# Patient Record
Sex: Female | Born: 1960
Health system: Southern US, Community
[De-identification: ages and names within clinical notes are randomized; demographics above are authoritative.]

## PROBLEM LIST (undated history)

## (undated) DIAGNOSIS — K2970 Gastritis, unspecified, without bleeding: Secondary | ICD-10-CM

## (undated) DIAGNOSIS — J45909 Unspecified asthma, uncomplicated: Secondary | ICD-10-CM

## (undated) DIAGNOSIS — T7840XA Allergy, unspecified, initial encounter: Secondary | ICD-10-CM

## (undated) DIAGNOSIS — E538 Deficiency of other specified B group vitamins: Secondary | ICD-10-CM

## (undated) DIAGNOSIS — E785 Hyperlipidemia, unspecified: Secondary | ICD-10-CM

## (undated) DIAGNOSIS — R0683 Snoring: Secondary | ICD-10-CM

## (undated) DIAGNOSIS — K219 Gastro-esophageal reflux disease without esophagitis: Secondary | ICD-10-CM

## (undated) DIAGNOSIS — M545 Low back pain, unspecified: Secondary | ICD-10-CM

## (undated) DIAGNOSIS — Z85820 Personal history of malignant melanoma of skin: Secondary | ICD-10-CM

## (undated) DIAGNOSIS — F32A Depression, unspecified: Secondary | ICD-10-CM

## (undated) DIAGNOSIS — F410 Panic disorder [episodic paroxysmal anxiety] without agoraphobia: Secondary | ICD-10-CM

## (undated) DIAGNOSIS — K209 Esophagitis, unspecified without bleeding: Secondary | ICD-10-CM

## (undated) DIAGNOSIS — F419 Anxiety disorder, unspecified: Secondary | ICD-10-CM

## (undated) DIAGNOSIS — F329 Major depressive disorder, single episode, unspecified: Secondary | ICD-10-CM

## (undated) DIAGNOSIS — F5104 Psychophysiologic insomnia: Secondary | ICD-10-CM

## (undated) HISTORY — DX: Personal history of malignant melanoma of skin: Z85.820

## (undated) HISTORY — DX: Snoring: R06.83

## (undated) HISTORY — DX: Unspecified asthma, uncomplicated: J45.909

## (undated) HISTORY — DX: Allergy, unspecified, initial encounter: T78.40XA

## (undated) HISTORY — DX: Psychophysiologic insomnia: F51.04

## (undated) HISTORY — DX: Gastritis, unspecified, without bleeding: K29.70

## (undated) HISTORY — DX: Low back pain, unspecified: M54.50

## (undated) HISTORY — PX: SHOULDER SURGERY: SHX246

## (undated) HISTORY — DX: Panic disorder (episodic paroxysmal anxiety): F41.0

## (undated) HISTORY — DX: Depression, unspecified: F32.A

## (undated) HISTORY — DX: Esophagitis, unspecified: K20.9

## (undated) HISTORY — DX: Esophagitis, unspecified without bleeding: K20.90

## (undated) HISTORY — DX: Major depressive disorder, single episode, unspecified: F32.9

## (undated) HISTORY — DX: Deficiency of other specified B group vitamins: E53.8

## (undated) HISTORY — PX: ABDOMINAL HYSTERECTOMY: SHX81

## (undated) HISTORY — PX: FUNCTIONAL ENDOSCOPIC SINUS SURGERY: SUR616

## (undated) HISTORY — DX: Hyperlipidemia, unspecified: E78.5

## (undated) HISTORY — PX: CHOLECYSTECTOMY: SHX55

## (undated) HISTORY — DX: Low back pain: M54.5

---

## 2004-05-21 ENCOUNTER — Ambulatory Visit: Payer: Self-pay | Admitting: Unknown Physician Specialty

## 2005-08-08 HISTORY — PX: REFRACTIVE SURGERY: SHX103

## 2005-10-05 ENCOUNTER — Ambulatory Visit: Payer: Self-pay | Admitting: Otolaryngology

## 2005-12-02 ENCOUNTER — Ambulatory Visit: Payer: Self-pay | Admitting: Family Medicine

## 2005-12-07 ENCOUNTER — Ambulatory Visit: Payer: Self-pay | Admitting: Family Medicine

## 2005-12-27 ENCOUNTER — Ambulatory Visit: Payer: Self-pay | Admitting: General Surgery

## 2006-10-26 ENCOUNTER — Emergency Department: Payer: Self-pay | Admitting: Emergency Medicine

## 2008-07-17 ENCOUNTER — Ambulatory Visit: Payer: Self-pay | Admitting: Family Medicine

## 2008-08-21 ENCOUNTER — Ambulatory Visit: Payer: Self-pay | Admitting: General Surgery

## 2008-08-25 ENCOUNTER — Ambulatory Visit: Payer: Self-pay | Admitting: General Surgery

## 2008-08-29 ENCOUNTER — Ambulatory Visit: Payer: Self-pay | Admitting: General Surgery

## 2009-11-26 ENCOUNTER — Emergency Department: Payer: Self-pay | Admitting: Emergency Medicine

## 2010-01-05 ENCOUNTER — Ambulatory Visit: Payer: Self-pay | Admitting: Unknown Physician Specialty

## 2010-01-06 LAB — HM COLONOSCOPY: HM COLON: NORMAL

## 2010-05-07 ENCOUNTER — Ambulatory Visit: Payer: Self-pay | Admitting: Unknown Physician Specialty

## 2010-05-23 ENCOUNTER — Ambulatory Visit: Payer: Self-pay | Admitting: Internal Medicine

## 2010-07-30 ENCOUNTER — Ambulatory Visit: Payer: Self-pay | Admitting: Unknown Physician Specialty

## 2010-08-23 ENCOUNTER — Encounter: Payer: Self-pay | Admitting: Unknown Physician Specialty

## 2010-09-08 ENCOUNTER — Encounter: Payer: Self-pay | Admitting: Unknown Physician Specialty

## 2010-12-30 ENCOUNTER — Ambulatory Visit: Payer: Self-pay

## 2011-08-26 ENCOUNTER — Ambulatory Visit: Payer: Self-pay | Admitting: Family Medicine

## 2012-07-08 LAB — HM MAMMOGRAPHY: HM Mammogram: NORMAL

## 2012-07-14 ENCOUNTER — Ambulatory Visit: Payer: Self-pay | Admitting: Internal Medicine

## 2012-10-04 ENCOUNTER — Emergency Department: Payer: Self-pay | Admitting: Emergency Medicine

## 2012-10-04 LAB — CBC
MCH: 30 pg (ref 26.0–34.0)
MCHC: 33.6 g/dL (ref 32.0–36.0)
MCV: 89 fL (ref 80–100)
Platelet: 212 10*3/uL (ref 150–440)
RDW: 13.6 % (ref 11.5–14.5)
WBC: 7.8 10*3/uL (ref 3.6–11.0)

## 2012-10-04 LAB — BASIC METABOLIC PANEL
Anion Gap: 4 — ABNORMAL LOW (ref 7–16)
Calcium, Total: 8.9 mg/dL (ref 8.5–10.1)
Co2: 27 mmol/L (ref 21–32)
Creatinine: 0.8 mg/dL (ref 0.60–1.30)
EGFR (African American): >60
Glucose: 103 mg/dL — ABNORMAL HIGH (ref 65–99)
Osmolality: 283 (ref 275–301)
Potassium: 3.8 mmol/L (ref 3.5–5.1)

## 2014-03-15 ENCOUNTER — Emergency Department: Payer: Self-pay | Admitting: Emergency Medicine

## 2014-09-05 ENCOUNTER — Ambulatory Visit: Payer: Self-pay | Admitting: Internal Medicine

## 2014-12-26 ENCOUNTER — Ambulatory Visit: Payer: Managed Care, Other (non HMO)

## 2014-12-26 ENCOUNTER — Ambulatory Visit
Admission: EM | Admit: 2014-12-26 | Discharge: 2014-12-26 | Disposition: A | Discharge status: Home / Self Care | Payer: Managed Care, Other (non HMO) | Attending: Family Medicine | Admitting: Family Medicine

## 2014-12-26 DIAGNOSIS — J069 Acute upper respiratory infection, unspecified: Secondary | ICD-10-CM | POA: Diagnosis not present

## 2014-12-26 DIAGNOSIS — J45901 Unspecified asthma with (acute) exacerbation: Secondary | ICD-10-CM

## 2014-12-26 DIAGNOSIS — J45909 Unspecified asthma, uncomplicated: Secondary | ICD-10-CM | POA: Diagnosis present

## 2014-12-26 HISTORY — DX: Anxiety disorder, unspecified: F41.9

## 2014-12-26 HISTORY — DX: Gastro-esophageal reflux disease without esophagitis: K21.9

## 2014-12-26 MED ORDER — AZITHROMYCIN 500 MG PO TABS: 500.0000 mg | ORAL_TABLET | Freq: Every day | ORAL | Status: AC

## 2014-12-26 MED ORDER — HYDROCOD POLST-CPM POLST ER 10-8 MG/5ML PO SUER: 5.0000 mL | Freq: Two times a day (BID) | ORAL | Status: DC | PRN

## 2014-12-26 MED ORDER — AZITHROMYCIN 500 MG PO TABS: 500.0000 mg | ORAL_TABLET | Freq: Every day | ORAL | Status: DC

## 2014-12-26 MED ORDER — IPRATROPIUM-ALBUTEROL 0.5-2.5 (3) MG/3ML IN SOLN
3.0000 mL | Freq: Once | RESPIRATORY_TRACT | Status: AC
  Administered 2014-12-26: 3 mL via RESPIRATORY_TRACT

## 2014-12-26 NOTE — ED Provider Notes (Signed)
SUBJECTIVE:  Jill Sampson is a 54 y.o. female who complains of sore throat, nasal congestion, cough for the last few days. Denies CP, SOB, N/V/D, abdominal pain, severe headache. Has tried OTC Medication without much relief. Has hx of asthma. Has used inhaler once daily. Denies hx of smoking.   Review of systems negative except mentioned above.   OBJECTIVE: She appears well, vital signs are as noted.  General: NAD HEENT: mild pharyngeal erythema, no exudate, no erythema of TMs, no cervical LAD Respiratory: CTA B Cardiology: RRR Abdomen: +BS, NT/ND, no guarding or rebound Neurological: CN II-XII grossly intact   ASSESSMENT:  URI, Asthma  PLAN: Mild hyperinflation noted on CXR. Patient requests Zithromax 500mg s for 3 days, discussed Delysm prn, patient requests Tussionex for nighttime use, use of inhaler as prescribed when necessary, rest, hydration, seek medical attention if symptoms persist or worsen.        Jolene ProvostKirtida Carols Clemence, MD 12/26/14 [phone]

## 2014-12-26 NOTE — ED Notes (Signed)
Azithromycin and Tussionex ordered in error by Dr. Allena KatzPatel

## 2014-12-26 NOTE — Discharge Instructions (Signed)

## 2014-12-26 NOTE — ED Notes (Signed)
Started 2 days ago with sore throat. Has progressed into chest. + productive green cough. Fever (temp not taken)

## 2015-02-18 ENCOUNTER — Other Ambulatory Visit: Payer: Self-pay

## 2015-02-18 MED ORDER — ESOMEPRAZOLE MAGNESIUM 40 MG PO CPDR: 40.0000 mg | DELAYED_RELEASE_CAPSULE | Freq: Every day | ORAL | Status: DC

## 2015-02-18 MED ORDER — BUPROPION HCL ER (XL) 150 MG PO TB24: 150.0000 mg | ORAL_TABLET | Freq: Every day | ORAL | Status: DC

## 2015-02-18 MED ORDER — ESCITALOPRAM OXALATE 10 MG PO TABS: 10.0000 mg | ORAL_TABLET | Freq: Every day | ORAL | Status: DC

## 2015-02-18 NOTE — Telephone Encounter (Signed)
Patient requesting refill. 

## 2015-02-20 ENCOUNTER — Other Ambulatory Visit: Payer: Self-pay

## 2015-02-24 ENCOUNTER — Other Ambulatory Visit: Payer: Self-pay

## 2015-02-24 ENCOUNTER — Telehealth: Payer: Self-pay | Admitting: Family Medicine

## 2015-02-24 MED ORDER — MELOXICAM 15 MG PO TABS: 15.0000 mg | ORAL_TABLET | Freq: Every day | ORAL | Status: DC | PRN

## 2015-02-24 MED ORDER — ESOMEPRAZOLE MAGNESIUM 40 MG PO CPDR: 40.0000 mg | DELAYED_RELEASE_CAPSULE | Freq: Every day | ORAL | Status: DC

## 2015-02-24 MED ORDER — ESCITALOPRAM OXALATE 10 MG PO TABS: 10.0000 mg | ORAL_TABLET | Freq: Every day | ORAL | Status: DC

## 2015-02-24 MED ORDER — BUPROPION HCL ER (XL) 150 MG PO TB24: 150.0000 mg | ORAL_TABLET | Freq: Every day | ORAL | Status: DC

## 2015-02-24 NOTE — Telephone Encounter (Signed)
Patient is requesting status on her refill requests: wellbutrin, lexapro, nexium, and mobic. Please send to [facility].

## 2015-02-24 NOTE — Telephone Encounter (Signed)
Per pt ins she must use [facility] and get 90 day supply please fax rx to new pharmacy for 90 day supply

## 2015-03-21 ENCOUNTER — Encounter: Payer: Self-pay | Admitting: Family Medicine

## 2015-03-21 DIAGNOSIS — E785 Hyperlipidemia, unspecified: Secondary | ICD-10-CM | POA: Insufficient documentation

## 2015-03-21 DIAGNOSIS — Z90710 Acquired absence of both cervix and uterus: Secondary | ICD-10-CM | POA: Insufficient documentation

## 2015-03-21 DIAGNOSIS — J3089 Other allergic rhinitis: Secondary | ICD-10-CM

## 2015-03-21 DIAGNOSIS — G8929 Other chronic pain: Secondary | ICD-10-CM | POA: Insufficient documentation

## 2015-03-21 DIAGNOSIS — R0683 Snoring: Secondary | ICD-10-CM | POA: Insufficient documentation

## 2015-03-21 DIAGNOSIS — Z85820 Personal history of malignant melanoma of skin: Secondary | ICD-10-CM | POA: Insufficient documentation

## 2015-03-21 DIAGNOSIS — F330 Major depressive disorder, recurrent, mild: Secondary | ICD-10-CM | POA: Insufficient documentation

## 2015-03-21 DIAGNOSIS — M545 Low back pain, unspecified: Secondary | ICD-10-CM | POA: Insufficient documentation

## 2015-03-21 DIAGNOSIS — G4700 Insomnia, unspecified: Secondary | ICD-10-CM | POA: Insufficient documentation

## 2015-03-21 DIAGNOSIS — K219 Gastro-esophageal reflux disease without esophagitis: Secondary | ICD-10-CM | POA: Insufficient documentation

## 2015-03-21 DIAGNOSIS — J4540 Moderate persistent asthma, uncomplicated: Secondary | ICD-10-CM | POA: Insufficient documentation

## 2015-03-21 DIAGNOSIS — J302 Other seasonal allergic rhinitis: Secondary | ICD-10-CM | POA: Insufficient documentation

## 2015-03-21 DIAGNOSIS — M722 Plantar fascial fibromatosis: Secondary | ICD-10-CM | POA: Insufficient documentation

## 2015-03-21 DIAGNOSIS — E538 Deficiency of other specified B group vitamins: Secondary | ICD-10-CM | POA: Insufficient documentation

## 2015-03-24 ENCOUNTER — Ambulatory Visit: Payer: Self-pay | Admitting: Family Medicine

## 2015-04-09 ENCOUNTER — Ambulatory Visit
Admission: EM | Admit: 2015-04-09 | Discharge: 2015-04-09 | Disposition: A | Discharge status: Home / Self Care | Payer: Managed Care, Other (non HMO) | Attending: Emergency Medicine | Admitting: Emergency Medicine

## 2015-04-09 ENCOUNTER — Encounter: Payer: Self-pay | Admitting: Emergency Medicine

## 2015-04-09 DIAGNOSIS — J329 Chronic sinusitis, unspecified: Secondary | ICD-10-CM

## 2015-04-09 MED ORDER — AZITHROMYCIN 500 MG PO TABS: ORAL_TABLET | ORAL | Status: DC

## 2015-04-09 NOTE — Discharge Instructions (Signed)

## 2015-04-09 NOTE — ED Provider Notes (Signed)
CSN: [id number]     Arrival date & time 04/09/15  1616 History   First MD Initiated Contact with Patient 04/09/15 1636     Chief Complaint  Patient presents with  . Facial Pain   (Consider location/radiation/quality/duration/timing/severity/associated sxs/prior Treatment) HPI  This a 54 year old female who presents with sinus issues been present for 3 weeks. States that they are worsening as time goes on. Unfortunately she has frequent sinus infections the last time she was here was in May 2016. This episode has caused her to have significant sinus pressure and drainage her teeth are hurting and she has a headache to her other sinus infections. She has not had a fever and has not experienced chills. She is afebrile today. She has been coughing recently from drainage and has not been productive. She  even used an albuterol inhaler for some asthma type symptoms that she had last night.  Past Medical History  Diagnosis Date  . GERD (gastroesophageal reflux disease)   . Anxiety   . Asthma   . Snoring   . Allergy   . Low back pain   . Dyslipidemia   . Vitamin B 12 deficiency   . Panic attack   . Chronic insomnia   . Depression   . History of melanoma    Past Surgical History  Procedure Laterality Date  . Abdominal hysterectomy    . Shoulder surgery Left   . Functional endoscopic sinus surgery Bilateral   . Refractive surgery  2007  . Cholecystectomy     Family History  Problem Relation Age of Onset  . Alzheimer's disease Mother   . Hypertension Mother   . Stroke Mother   . Migraines Mother   . Heart attack Father   . Heart disease Father   . Cancer Sister     Breast  . Stroke Maternal Aunt   . Diabetes Maternal Uncle    Social History  Substance Use Topics  . Smoking status: Never Smoker   . Smokeless tobacco: None  . Alcohol Use: No   OB History    No data available     Review of Systems  Constitutional: Negative for fever, chills and fatigue.  HENT: Positive for  congestion, postnasal drip, rhinorrhea, sinus pressure and sore throat.   Eyes: Positive for discharge. Negative for pain and itching.  Respiratory: Positive for cough.   All other systems reviewed and are negative.   Allergies  Levofloxacin; Amoxicillin; and Penicillins  Home Medications   Prior to Admission medications   Medication Sig Start Date End Date Taking? Authorizing Provider  azithromycin (ZITHROMAX) 500 MG tablet Take 5oo mg daily for 3 days. 04/09/15   Lutricia Feil, PA-C  buPROPion (WELLBUTRIN XL) 150 MG 24 hr tablet Take 1 tablet (150 mg total) by mouth daily. 02/24/15   Alba Cory, MD  chlorpheniramine-HYDROcodone (TUSSIONEX PENNKINETIC ER) 10-8 MG/5ML SUER Take 5 mLs by mouth every 12 (twelve) hours as needed for cough. 12/26/14   Jolene Provost, MD  EEMT HS 0.625-1.25 MG per tablet Take 1 tablet by mouth daily. 12/21/14   Historical Provider, MD  escitalopram (LEXAPRO) 10 MG tablet Take 1 tablet (10 mg total) by mouth daily. 02/24/15   Alba Cory, MD  esomeprazole (NEXIUM) 40 MG capsule Take 1 capsule (40 mg total) by mouth daily at 12 noon. 02/24/15   Alba Cory, MD  meloxicam (MOBIC) 15 MG tablet Take 1 tablet (15 mg total) by mouth daily as needed. 02/24/15   Alba Cory,  MD   Meds Ordered and Administered this Visit  Medications - No data to display  BP 139/78 mmHg  Pulse 62  Temp(Src) 97.8 F (36.6 C) (Tympanic)  Resp 18  Ht  (1.702 m)  Wt 175 lb (79.379 kg)  BMI 27.40 kg/m2  SpO2 99% No data found.   Physical Exam  Constitutional: She is oriented to person, place, and time. She appears well-developed and well-nourished.  HENT:  Head: Atraumatic.  Left Ear: External ear normal.  The right TM is dull. She has tenderness to percussion over the sinuses maxillary and frontal. There is postnasal drip present.  Eyes: EOM are normal. Pupils are equal, round, and reactive to light. Right eye exhibits no discharge. Left eye exhibits no discharge.   Neck: Normal range of motion. Neck supple.  Pulmonary/Chest: Effort normal and breath sounds normal. No respiratory distress. She has no wheezes. She has no rales.  Musculoskeletal: Normal range of motion. She exhibits no edema.  Lymphadenopathy:    She has no cervical adenopathy.  Neurological: She is alert and oriented to person, place, and time.  Skin: Skin is warm and dry. No rash noted. No erythema.  Psychiatric: She has a normal mood and affect. Her behavior is normal. Judgment and thought content normal.  Nursing note and vitals reviewed.   ED Course  Procedures (including critical care time)  Labs Review Labs Reviewed - No data to display  Imaging Review No results found.   Visual Acuity Review  Right Eye Distance:   Left Eye Distance:   Bilateral Distance:    Right Eye Near:   Left Eye Near:    Bilateral Near:         MDM   1. Chronic sinusitis, unspecified location    Discharge Medication List as of 04/09/2015  5:07 PM    START taking these medications   Details  azithromycin (ZITHROMAX) 500 MG tablet Take 5oo mg daily for 3 days., Print        Plan: 1. Diagnosis reviewed with patient 2. rx as per orders; risks, benefits, potential side effects reviewed with patient 3. Recommend supportive treatment with Flonase ,ibuprofen and rest. Increase fluids. Use tussionex that you have at home PRN for cough at night. 4. F/u prn if symptoms worsen or don't improve   Lutricia Feil, PA-C 04/09/15 1713

## 2015-04-09 NOTE — ED Notes (Signed)
Sinus pressure

## 2015-05-07 ENCOUNTER — Ambulatory Visit (INDEPENDENT_AMBULATORY_CARE_PROVIDER_SITE_OTHER): Payer: Managed Care, Other (non HMO) | Admitting: Family Medicine

## 2015-05-07 ENCOUNTER — Encounter (INDEPENDENT_AMBULATORY_CARE_PROVIDER_SITE_OTHER): Payer: Self-pay

## 2015-05-07 ENCOUNTER — Encounter: Payer: Self-pay | Admitting: Family Medicine

## 2015-05-07 VITALS — BP 122/70 | HR 66 | Temp 98.4°F | Resp 18 | Ht 67.0 in | Wt 189.9 lb

## 2015-05-07 DIAGNOSIS — F324 Major depressive disorder, single episode, in partial remission: Secondary | ICD-10-CM

## 2015-05-07 DIAGNOSIS — K449 Diaphragmatic hernia without obstruction or gangrene: Secondary | ICD-10-CM

## 2015-05-07 DIAGNOSIS — E785 Hyperlipidemia, unspecified: Secondary | ICD-10-CM

## 2015-05-07 DIAGNOSIS — J309 Allergic rhinitis, unspecified: Secondary | ICD-10-CM | POA: Diagnosis not present

## 2015-05-07 DIAGNOSIS — K219 Gastro-esophageal reflux disease without esophagitis: Secondary | ICD-10-CM

## 2015-05-07 DIAGNOSIS — Z79899 Other long term (current) drug therapy: Secondary | ICD-10-CM

## 2015-05-07 DIAGNOSIS — M2550 Pain in unspecified joint: Secondary | ICD-10-CM

## 2015-05-07 DIAGNOSIS — Z23 Encounter for immunization: Secondary | ICD-10-CM | POA: Diagnosis not present

## 2015-05-07 DIAGNOSIS — E538 Deficiency of other specified B group vitamins: Secondary | ICD-10-CM | POA: Diagnosis not present

## 2015-05-07 DIAGNOSIS — J3089 Other allergic rhinitis: Secondary | ICD-10-CM

## 2015-05-07 DIAGNOSIS — F325 Major depressive disorder, single episode, in full remission: Secondary | ICD-10-CM

## 2015-05-07 DIAGNOSIS — J302 Other seasonal allergic rhinitis: Secondary | ICD-10-CM

## 2015-05-07 DIAGNOSIS — G4700 Insomnia, unspecified: Secondary | ICD-10-CM | POA: Diagnosis not present

## 2015-05-07 MED ORDER — ESOMEPRAZOLE MAGNESIUM 40 MG PO CPDR: 40.0000 mg | DELAYED_RELEASE_CAPSULE | Freq: Every day | ORAL | Status: DC

## 2015-05-07 MED ORDER — ESCITALOPRAM OXALATE 10 MG PO TABS: 10.0000 mg | ORAL_TABLET | Freq: Every day | ORAL | Status: DC

## 2015-05-07 MED ORDER — FLUTICASONE PROPIONATE 50 MCG/ACT NA SUSP: 2.0000 | NASAL | Status: DC | PRN

## 2015-05-07 MED ORDER — ALPRAZOLAM 0.5 MG PO TABS: 0.5000 mg | ORAL_TABLET | ORAL | Status: DC | PRN

## 2015-05-07 MED ORDER — MELOXICAM 15 MG PO TABS: 15.0000 mg | ORAL_TABLET | Freq: Every day | ORAL | Status: DC | PRN

## 2015-05-07 MED ORDER — BUPROPION HCL ER (XL) 150 MG PO TB24: 150.0000 mg | ORAL_TABLET | Freq: Every day | ORAL | Status: DC

## 2015-05-07 NOTE — Progress Notes (Signed)
Name: Jill Sampson   MRN: [id number]    DOB: [birth year]   Date:05/07/2015       Progress Note  Subjective  Chief Complaint  Chief Complaint  Patient presents with  . Medication Refill    follow-up  . Depression  . Asthma    only flares when sick    HPI  Major Depression in Remission: she has a long history of depression, but is feeling well with Wellbutrin XL, she feels like she is back to her baseline. No crying spells, no fatigue, no anhedonia.    Asthma   05/07/15 1639  Asthma History  Symptoms 0-2 days/week  Nighttime Awakenings 0-2/month  Asthma interference with normal activity No limitations  SABA use (not for EIB) 0-2 days/wk  Risk: Exacerbations requiring oral systemic steroids 0-1 / year  Asthma Severity Intermittent   Hiatal Hernia: seen by Dr.Elliott many years ago and per patient diagnosed with hiatal hernia, having more reflux symptoms and is going back,she is willing to have surgery to control symptoms  Arthralgia: states that for the past couple of years she wakes up feeling stiff all over, and lasts about 1 hour to feel better. Also has generalized joint aches and takes Meloxicam to control symptoms every other day. No redness , no swelling. No family of RF or autoimmune disease.   Insomnia; doing well, takes alprazolam prn only   B12: stopped B12 supplementation, but denies fatigue  Dyslipidemia: she is not on medication, no labs in years, because insurance does not cover [facility], she will try to go to [facility] to have labs done  Patient Active Problem List   Diagnosis Date Noted  . Ventral hernia without obstruction or gangrene 05/07/2015  . Arthralgia 05/07/2015  . Insomnia, persistent 03/21/2015  . Chronic LBP 03/21/2015  . Dyslipidemia 03/21/2015  . Gastro-esophageal reflux disease without esophagitis 03/21/2015  . H/O: hysterectomy 03/21/2015  . H/O Malignant melanoma 03/21/2015  . Depression, major, recurrent, mild 03/21/2015  . Asthma,  mild intermittent 03/21/2015  . Plantar fasciitis 03/21/2015  . B12 deficiency 03/21/2015  . Snores 03/21/2015  . Perennial allergic rhinitis with seasonal variation 03/21/2015    Past Surgical History  Procedure Laterality Date  . Abdominal hysterectomy    . Shoulder surgery Left   . Functional endoscopic sinus surgery Bilateral   . Refractive surgery  2007  . Cholecystectomy      Family History  Problem Relation Age of Onset  . Alzheimer's disease Mother   . Hypertension Mother   . Stroke Mother   . Migraines Mother   . Heart attack Father   . Heart disease Father   . Cancer Sister     Breast  . Stroke Maternal Aunt   . Diabetes Maternal Uncle     Social History   Social History  . Marital Status: Single    Spouse Name: N/A  . Number of Children: N/A  . Years of Education: N/A   Occupational History  . Not on file.   Social History Main Topics  . Smoking status: Never Smoker   . Smokeless tobacco: Never Used  . Alcohol Use: No  . Drug Use: Not on file  . Sexual Activity: Yes   Other Topics Concern  . Not on file   Social History Narrative     Current outpatient prescriptions:  .  albuterol (PROVENTIL HFA) 108 (90 BASE) MCG/ACT inhaler, Inhale 2 puffs into the lungs every 4 (four) hours as needed., Disp: , Rfl:  .  ALPRAZolam (XANAX) 0.5 MG tablet, Take 1 tablet (0.5 mg total) by mouth as needed., Disp: 30 tablet, Rfl: 0 .  buPROPion (WELLBUTRIN XL) 150 MG 24 hr tablet, Take 1 tablet (150 mg total) by mouth daily., Disp: 90 tablet, Rfl: 0 .  escitalopram (LEXAPRO) 10 MG tablet, Take 1 tablet (10 mg total) by mouth daily., Disp: 90 tablet, Rfl: 0 .  esomeprazole (NEXIUM) 40 MG capsule, Take 1 capsule (40 mg total) by mouth daily at 12 noon., Disp: 90 capsule, Rfl: 1 .  fluticasone (FLONASE) 50 MCG/ACT nasal spray, Place 2 sprays into both nostrils as needed., Disp: 48 g, Rfl: 1 .  meloxicam (MOBIC) 15 MG tablet, Take 1 tablet (15 mg total) by mouth daily  as needed., Disp: 90 tablet, Rfl: 0  Allergies  Allergen Reactions  . Levofloxacin     rash  . Amoxicillin Rash  . Penicillins Rash     ROS  Constitutional: Negative for fever , positive for weight change.  Respiratory: Negative for cough and shortness of breath.   Cardiovascular: Negative for chest pain or palpitations.  Gastrointestinal: Negative for abdominal pain, no bowel changes.  Musculoskeletal: Negative for gait problem or joint swelling.  Skin: Negative for rash.  Neurological: Negative for dizziness or headache.  No other specific complaints in a complete review of systems (except as listed in HPI above).   Objective  Filed Vitals:   05/07/15 1608  BP: 122/70  Pulse: 66  Temp: 98.4 F (36.9 C)  TempSrc: Oral  Resp: 18  Height:  (1.702 m)  Weight: 189 lb 14.4 oz (86.138 kg)  SpO2: 97%    Body mass index is 29.74 kg/(m^2).  Physical Exam  Constitutional: Patient appears well-developed and well-nourished. Obese  No distress.  HEENT: head atraumatic, normocephalic, pupils equal and reactive to light, neck supple, throat within normal limits Cardiovascular: Normal rate, regular rhythm and normal heart sounds.  No murmur heard. No BLE edema. Pulmonary/Chest: Effort normal and breath sounds normal. No respiratory distress. Abdominal: Soft.  There is no tenderness. Psychiatric: Patient has a normal mood and affect. behavior is normal. Judgment and thought content normal. Muscular Skeletal: no pain during examination, normal low back exam, no synovitis   PHQ2/9: Depression screen PHQ 2/9 05/07/2015  Decreased Interest 0  Down, Depressed, Hopeless 0  PHQ - 2 Score 0    Fall Risk: Fall Risk  05/07/2015  Falls in the past year? No     Functional Status Survey: Is the patient deaf or have difficulty hearing?: No Does the patient have difficulty seeing, even when wearing glasses/contacts?: Yes (contacts) Does the patient have difficulty  concentrating, remembering, or making decisions?: No Does the patient have difficulty walking or climbing stairs?: No Does the patient have difficulty dressing or bathing?: No Does the patient have difficulty doing errands alone such as visiting a doctor's office or shopping?: No    Assessment & Plan  1. Depression, major, recurrent, mild  - escitalopram (LEXAPRO) 10 MG tablet; Take 1 tablet (10 mg total) by mouth daily.  Dispense: 90 tablet; Refill: 0 - buPROPion (WELLBUTRIN XL) 150 MG 24 hr tablet; Take 1 tablet (150 mg total) by mouth daily.  Dispense: 90 tablet; Refill: 0  2. Needs flu shot  - Flu Vaccine QUAD 36+ mos PF IM (Fluarix & Fluzone Quad PF)  3. Dyslipidemia  - Lipid panel  4. Hiatal hernia  Following up with GI soon.   5. B12 deficiency  - Vitamin B12  6. Gastro-esophageal reflux disease without esophagitis  - esomeprazole (NEXIUM) 40 MG capsule; Take 1 capsule (40 mg total) by mouth daily at 12 noon.  Dispense: 90 capsule; Refill: 1  7. Insomnia, persistent  - ALPRAZolam (XANAX) 0.5 MG tablet; Take 1 tablet (0.5 mg total) by mouth as needed.  Dispense: 30 tablet; Refill: 0  8. Long-term use of high-risk medication  - Comprehensive metabolic panel  9. Perennial allergic rhinitis with seasonal variation  - fluticasone (FLONASE) 50 MCG/ACT nasal spray; Place 2 sprays into both nostrils as needed.  Dispense: 48 g; Refill: 1  10. Arthralgia  Used to play sports, currently sedentary work  - meloxicam (MOBIC) 15 MG tablet; Take 1 tablet (15 mg total) by mouth daily as needed.  Dispense: 90 tablet; Refill: 0 - Sedimentation rate - Rheumatoid Factor

## 2015-05-07 NOTE — Progress Notes (Signed)
   05/07/15 1639  Asthma History  Symptoms 0-2 days/week  Nighttime Awakenings 0-2/month  Asthma interference with normal activity No limitations  SABA use (not for EIB) 0-2 days/wk  Risk: Exacerbations requiring oral systemic steroids 0-1 / year  Asthma Severity Intermittent

## 2015-07-23 ENCOUNTER — Ambulatory Visit
Admission: EM | Admit: 2015-07-23 | Discharge: 2015-07-23 | Disposition: A | Discharge status: Home / Self Care | Payer: Managed Care, Other (non HMO) | Attending: Family Medicine | Admitting: Family Medicine

## 2015-07-23 DIAGNOSIS — J0100 Acute maxillary sinusitis, unspecified: Secondary | ICD-10-CM

## 2015-07-23 DIAGNOSIS — J40 Bronchitis, not specified as acute or chronic: Secondary | ICD-10-CM

## 2015-07-23 MED ORDER — AZITHROMYCIN 500 MG PO TABS: ORAL_TABLET | ORAL | Status: DC

## 2015-07-23 MED ORDER — HYDROCOD POLST-CPM POLST ER 10-8 MG/5ML PO SUER: 5.0000 mL | Freq: Two times a day (BID) | ORAL | Status: DC | PRN

## 2015-07-23 NOTE — Discharge Instructions (Signed)
Sinusitis, Adult °Sinusitis is redness, soreness, and puffiness (inflammation) of the air pockets in the bones of your face (sinuses). The redness, soreness, and puffiness can cause air and mucus to get trapped in your sinuses. This can allow germs to grow and cause an infection.  °HOME CARE  °· Drink enough fluids to keep your pee (urine) clear or pale yellow. °· Use a humidifier in your home. °· Run a hot shower to create steam in the bathroom. Sit in the bathroom with the door closed. Breathe in the steam 3-4 times a day. °· Put a warm, moist washcloth on your face 3-4 times a day, or as told by your doctor. °· Use salt water sprays (saline sprays) to wet the thick fluid in your nose. This can help the sinuses drain. °· Only take medicine as told by your doctor. °GET HELP RIGHT AWAY IF:  °· Your pain gets worse. °· You have very bad headaches. °· You are sick to your stomach (nauseous). °· You throw up (vomit). °· You are very sleepy (drowsy) all the time. °· Your face is puffy (swollen). °· Your vision changes. °· You have a stiff neck. °· You have trouble breathing. °MAKE SURE YOU:  °· Understand these instructions. °· Will watch your condition. °· Will get help right away if you are not doing well or get worse. °  °This information is not intended to replace advice given to you by your health care provider. Make sure you discuss any questions you have with your health care provider. °  °Document Released: 01/11/2008 Document Revised: 08/15/2014 Document Reviewed: 02/28/2012 °Elsevier Interactive Patient Education ©2016 [employer] ° °Upper Respiratory Infection, Adult °Most upper respiratory infections (URIs) are caused by a virus. A URI affects the nose, throat, and upper air passages. The most common type of URI is often called "the common cold." °HOME CARE  °· Take medicines only as told by your doctor. °· Gargle warm saltwater or take cough drops to comfort your throat as told by your doctor. °· Use a  warm mist humidifier or inhale steam from a shower to increase air moisture. This may make it easier to breathe. °· Drink enough fluid to keep your pee (urine) clear or pale yellow. °· Eat soups and other clear broths. °· Have a healthy diet. °· Rest as needed. °· Go back to work when your fever is gone or your doctor says it is okay. °¨ You may need to stay home longer to avoid giving your URI to others. °¨ You can also wear a face mask and wash your hands often to prevent spread of the virus. °· Use your inhaler more if you have asthma. °· Do not use any tobacco products, including cigarettes, chewing tobacco, or electronic cigarettes. If you need help quitting, ask your doctor. °GET HELP IF: °· You are getting worse, not better. °· Your symptoms are not helped by medicine. °· You have chills. °· You are getting more short of breath. °· You have brown or red mucus. °· You have yellow or brown discharge from your nose. °· You have pain in your face, especially when you bend forward. °· You have a fever. °· You have puffy (swollen) neck glands. °· You have pain while swallowing. °· You have white areas in the back of your throat. °GET HELP RIGHT AWAY IF:  °· You have very bad or constant: °¨ Headache. °¨ Ear pain. °¨ Pain in your forehead, behind your eyes, and over   your cheekbones (sinus pain). °¨ Chest pain. °· You have long-lasting (chronic) lung disease and any of the following: °¨ Wheezing. °¨ Long-lasting cough. °¨ Coughing up blood. °¨ A change in your usual mucus. °· You have a stiff neck. °· You have changes in your: °¨ Vision. °¨ Hearing. °¨ Thinking. °¨ Mood. °MAKE SURE YOU:  °· Understand these instructions. °· Will watch your condition. °· Will get help right away if you are not doing well or get worse. °  °This information is not intended to replace advice given to you by your health care provider. Make sure you discuss any questions you have with your health care provider. °  °Document Released:  01/11/2008 Document Revised: 12/09/2014 Document Reviewed: 10/30/2013 °Elsevier Interactive Patient Education ©2016 [employer] Inc. ° °

## 2015-07-23 NOTE — ED Notes (Signed)
Started 2 weeks ago with sinus congestion. "I need a Z-Pack".

## 2015-07-23 NOTE — ED Provider Notes (Signed)
CSN: [id number]     Arrival date & time 07/23/15  1154 History   First MD Initiated Contact with Patient 07/23/15 1409    Nurses notes were reviewed. Chief Complaint  Patient presents with  . Facial Pain   Patient is here because of sinus pain patient pain. She reports symptoms started about 2 weeks ago. Zithromax worked well for the past only the 500 mg tablets. She also had a sore throat and some pain on the right side of her years. She has cough which she's used Tussionex for the Tussionex that she is just day with cough continue was an older prescription so she wants a new prescription possible  (Consider location/radiation/quality/duration/timing/severity/associated sxs/prior Treatment) Patient is a 54 y.o. female presenting with URI. The history is provided by the patient. No language interpreter was used.  URI Presenting symptoms: cough, ear pain and sore throat   Severity:  Moderate Onset quality:  Sudden Timing:  Constant Progression:  Waxing and waning Chronicity:  New Relieved by:  Nothing Ineffective treatments:  Prescription medications (tussionex from home) Associated symptoms: sinus pain   Risk factors: not elderly, no chronic cardiac disease, no chronic kidney disease and no chronic respiratory disease     Past Medical History  Diagnosis Date  . GERD (gastroesophageal reflux disease)   . Anxiety   . Asthma   . Snoring   . Allergy   . Low back pain   . Dyslipidemia   . Vitamin B 12 deficiency   . Panic attack   . Chronic insomnia   . Depression   . History of melanoma    Past Surgical History  Procedure Laterality Date  . Abdominal hysterectomy    . Shoulder surgery Left   . Functional endoscopic sinus surgery Bilateral   . Refractive surgery  2007  . Cholecystectomy     Family History  Problem Relation Age of Onset  . Alzheimer's disease Mother   . Hypertension Mother   . Stroke Mother   . Migraines Mother   . Heart attack Father   . Heart  disease Father   . Cancer Sister     Breast  . Stroke Maternal Aunt   . Diabetes Maternal Uncle    Social History  Substance Use Topics  . Smoking status: Never Smoker   . Smokeless tobacco: Never Used  . Alcohol Use: No   OB History    No data available     Review of Systems  HENT: Positive for ear pain, facial swelling, sinus pressure and sore throat.   Respiratory: Positive for cough.   All other systems reviewed and are negative.   Allergies  Levofloxacin; Amoxicillin; and Penicillins  Home Medications   Prior to Admission medications   Medication Sig Start Date End Date Taking? Authorizing Provider  albuterol (PROVENTIL HFA) 108 (90 BASE) MCG/ACT inhaler Inhale 2 puffs into the lungs every 4 (four) hours as needed. 11/08/13  Yes Historical Provider, MD  buPROPion (WELLBUTRIN XL) 150 MG 24 hr tablet Take 1 tablet (150 mg total) by mouth daily. 05/07/15  Yes Alba Cory, MD  escitalopram (LEXAPRO) 10 MG tablet Take 1 tablet (10 mg total) by mouth daily. 05/07/15  Yes Alba Cory, MD  esomeprazole (NEXIUM) 40 MG capsule Take 1 capsule (40 mg total) by mouth daily at 12 noon. 05/07/15  Yes Alba Cory, MD  estrogen-methylTESTOSTERone (ESTRATEST) 1.25-2.5 MG tablet Take 1 tablet by mouth daily.   Yes Historical Provider, MD  fluticasone (FLONASE) 50 MCG/ACT  nasal spray Place 2 sprays into both nostrils as needed. 05/07/15  Yes Alba CoryKrichna Sowles, MD  meloxicam (MOBIC) 15 MG tablet Take 1 tablet (15 mg total) by mouth daily as needed. 05/07/15  Yes Alba CoryKrichna Sowles, MD  ALPRAZolam Prudy Feeler(XANAX) 0.5 MG tablet Take 1 tablet (0.5 mg total) by mouth as needed. 05/07/15   Alba CoryKrichna Sowles, MD  azithromycin (ZITHROMAX) 500 MG tablet Take 1 tablet daily for 5 days 07/23/15   Hassan RowanEugene Fryda Molenda, MD  chlorpheniramine-HYDROcodone [facility](TUSSIONEX PENNKINETIC ER) 10-8 MG/5ML SUER Take 5 mLs by mouth every 12 (twelve) hours as needed for cough. 07/23/15   Hassan RowanEugene Aryan Sparks, MD   Meds Ordered and Administered this Visit   Medications - No data to display  BP 141/74 mmHg  Pulse 62  Temp(Src) 96.6 F (35.9 C) (Tympanic)  Resp 18  Ht 5\' 8"  (1.727 m)  Wt 165 lb (74.844 kg)  BMI 25.09 kg/m2  SpO2 100% No data found.   Physical Exam  ED Course  Procedures (including critical care time)  Labs Review Labs Reviewed - No data to display  Imaging Review No results found.   Visual Acuity Review  Right Eye Distance:   Left Eye Distance:   Bilateral Distance:    Right Eye Near:   Left Eye Near:    Bilateral Near:         MDM   1. Acute maxillary sinusitis, recurrence not specified   2. Bronchitis    We'll place her on Zithromax 500 mg for 5 days Tussionex 1 teaspoon twice a day. Work note given for today and tomorrow and return to see PCP if not better 1-2 weeks.    Hassan RowanEugene Wayne Brunker, MD 07/23/15 [phone]

## 2015-08-07 ENCOUNTER — Ambulatory Visit: Payer: Managed Care, Other (non HMO) | Admitting: Family Medicine

## 2015-09-10 ENCOUNTER — Other Ambulatory Visit: Payer: Self-pay | Admitting: Family Medicine

## 2015-09-11 NOTE — Telephone Encounter (Signed)
Patient requesting refill. 

## 2015-09-14 NOTE — Telephone Encounter (Signed)
Left voice message to inform patient that prescription where sent to the pharmacy and that she need to schedule appointment

## 2015-10-08 ENCOUNTER — Ambulatory Visit (INDEPENDENT_AMBULATORY_CARE_PROVIDER_SITE_OTHER): Payer: Managed Care, Other (non HMO) | Admitting: Family Medicine

## 2015-10-08 ENCOUNTER — Encounter: Payer: Self-pay | Admitting: Family Medicine

## 2015-10-08 VITALS — BP 128/76 | HR 71 | Temp 98.8°F | Resp 16 | Wt 199.2 lb

## 2015-10-08 DIAGNOSIS — E538 Deficiency of other specified B group vitamins: Secondary | ICD-10-CM

## 2015-10-08 DIAGNOSIS — E785 Hyperlipidemia, unspecified: Secondary | ICD-10-CM | POA: Diagnosis not present

## 2015-10-08 DIAGNOSIS — Z79899 Other long term (current) drug therapy: Secondary | ICD-10-CM

## 2015-10-08 DIAGNOSIS — R635 Abnormal weight gain: Secondary | ICD-10-CM

## 2015-10-08 DIAGNOSIS — K2970 Gastritis, unspecified, without bleeding: Secondary | ICD-10-CM | POA: Insufficient documentation

## 2015-10-08 DIAGNOSIS — J4521 Mild intermittent asthma with (acute) exacerbation: Secondary | ICD-10-CM

## 2015-10-08 DIAGNOSIS — F325 Major depressive disorder, single episode, in full remission: Secondary | ICD-10-CM | POA: Diagnosis not present

## 2015-10-08 DIAGNOSIS — M2550 Pain in unspecified joint: Secondary | ICD-10-CM | POA: Diagnosis not present

## 2015-10-08 DIAGNOSIS — J320 Chronic maxillary sinusitis: Secondary | ICD-10-CM

## 2015-10-08 DIAGNOSIS — K219 Gastro-esophageal reflux disease without esophagitis: Secondary | ICD-10-CM

## 2015-10-08 MED ORDER — ALBUTEROL SULFATE HFA 108 (90 BASE) MCG/ACT IN AERS: 2.0000 | INHALATION_SPRAY | RESPIRATORY_TRACT | Status: DC | PRN

## 2015-10-08 MED ORDER — FLUTICASONE FUROATE-VILANTEROL 100-25 MCG/INH IN AEPB: 1.0000 | INHALATION_SPRAY | Freq: Every day | RESPIRATORY_TRACT | Status: DC

## 2015-10-08 MED ORDER — BUPROPION HCL ER (XL) 150 MG PO TB24: 150.0000 mg | ORAL_TABLET | Freq: Every day | ORAL | Status: DC

## 2015-10-08 MED ORDER — AZITHROMYCIN 500 MG PO TABS: 500.0000 mg | ORAL_TABLET | Freq: Every day | ORAL | Status: DC

## 2015-10-08 MED ORDER — MELOXICAM 15 MG PO TABS
15.0000 mg | ORAL_TABLET | Freq: Every day | ORAL | Status: DC | PRN
Start: 2015-10-08 — End: 2016-03-09

## 2015-10-08 MED ORDER — ESCITALOPRAM OXALATE 10 MG PO TABS: 10.0000 mg | ORAL_TABLET | Freq: Every day | ORAL | Status: DC

## 2015-10-08 NOTE — Progress Notes (Addendum)
Name: Jill Sampson   MRN: [id number]    DOB: 04-01-1961   Date:10/08/2015       Progress Note  Subjective  Chief Complaint  Chief Complaint  Patient presents with  . Medication Refill  . Sinusitis    patient presents with facial pain and pressure. otc Tylenol Sinus    HPI  GERD with esophagitis: she went to see Dr. Markham Jordan and had EGD, hiatal hernia not severe enough for surgery, but she has esophagitis and gastritis, was given carafate, still on Nexium and she was given Flovent. She states symptoms have improved, so epigastric pain or discomfort at this time  Asthma mild intermittent: she states that for the past week she has noticed wheezing during the night and day, mild cough, and some SOB with activity . She is on Flovent for esophagitis, however it does not seem to help as Dulera did in the past. She is also out of rescue inhaler  Sinusitis: she developed right maxillary pain, post nasal drainage and intermittent hoarseness. No fever, or chills. Symptoms worse over the past few days. She also has a headache and feels tired. Normal appetite, it has not affected her sense of smell.   Arthralgia: states that for the past couple of years she wakes up feeling stiff all over, and lasts about 1 hour to feel better. Also has generalized joint aches and takes Meloxicam to control symptoms every other day. No redness , no swelling. No family of RF or autoimmune disease. She is taking Meloxicam prn and is doing well.   Major Depression in Remission: she has a long history of depression, but is feeling well with Wellbutrin XL, she feels like she is back to her baseline. No crying spells, no fatigue, no anhedonia.   Patient Active Problem List   Diagnosis Date Noted  . Gastritis 10/08/2015  . Hiatal hernia 05/07/2015  . Arthralgia 05/07/2015  . Insomnia, persistent 03/21/2015  . Chronic LBP 03/21/2015  . Dyslipidemia 03/21/2015  . GERD without esophagitis 03/21/2015  . H/O:  hysterectomy 03/21/2015  . H/O Malignant melanoma 03/21/2015  . Depression, major, recurrent, mild (HCC) 03/21/2015  . Asthma, mild intermittent 03/21/2015  . Plantar fasciitis 03/21/2015  . B12 deficiency 03/21/2015  . Snores 03/21/2015  . Perennial allergic rhinitis with seasonal variation 03/21/2015    Past Surgical History  Procedure Laterality Date  . Abdominal hysterectomy    . Shoulder surgery Left   . Functional endoscopic sinus surgery Bilateral   . Refractive surgery  2007  . Cholecystectomy      Family History  Problem Relation Age of Onset  . Alzheimer's disease Mother   . Hypertension Mother   . Stroke Mother   . Migraines Mother   . Heart attack Father   . Heart disease Father   . Cancer Sister     Breast  . Stroke Maternal Aunt   . Diabetes Maternal Uncle     Social History   Social History  . Marital Status: Single    Spouse Name: N/A  . Number of Children: N/A  . Years of Education: N/A   Occupational History  . Not on file.   Social History Main Topics  . Smoking status: Never Smoker   . Smokeless tobacco: Never Used  . Alcohol Use: No  . Drug Use: Not on file  . Sexual Activity: Yes   Other Topics Concern  . Not on file   Social History Narrative     Current outpatient  prescriptions:  .  albuterol (PROVENTIL HFA) 108 (90 Base) MCG/ACT inhaler, Inhale 2 puffs into the lungs every 4 (four) hours as needed., Disp: 18 g, Rfl: 0 .  ALPRAZolam (XANAX) 0.5 MG tablet, Take 1 tablet (0.5 mg total) by mouth as needed., Disp: 30 tablet, Rfl: 0 .  buPROPion (WELLBUTRIN XL) 150 MG 24 hr tablet, TAKE 1 TABLET BY MOUTH EVERY DAY, Disp: 30 tablet, Rfl: 0 .  escitalopram (LEXAPRO) 10 MG tablet, TAKE 1 TABLET BY MOUTH DAILY, Disp: 30 tablet, Rfl: 0 .  esomeprazole (NEXIUM) 40 MG capsule, Take 1 capsule (40 mg total) by mouth daily at 12 noon., Disp: 90 capsule, Rfl: 1 .  estrogen-methylTESTOSTERone (ESTRATEST) 1.25-2.5 MG tablet, Take 1 tablet by mouth  daily., Disp: , Rfl:  .  fluticasone (FLONASE) 50 MCG/ACT nasal spray, Place 2 sprays into both nostrils as needed., Disp: 48 g, Rfl: 1 .  meloxicam (MOBIC) 15 MG tablet, Take 1 tablet (15 mg total) by mouth daily as needed., Disp: 90 tablet, Rfl: 0 .  sucralfate (CARAFATE) 1 g tablet, TAKE 1 TABLET BY MOUTH TWICE A DAY BETWEEN MEALS, Disp: , Rfl: 2 .  fluticasone furoate-vilanterol (BREO ELLIPTA) 100-25 MCG/INH AEPB, Inhale 1 puff into the lungs daily., Disp: 60 each, Rfl: 1  Allergies  Allergen Reactions  . Levofloxacin     rash  . Amoxicillin Rash  . Penicillins Rash     ROS  Ten systems reviewed and is negative except as mentioned in HPI   Objective  Filed Vitals:   10/08/15 1557  BP: 128/76  Pulse: 71  Temp: 98.8 F (37.1 C)  TempSrc: Oral  Resp: 16  Weight: 199 lb 3.2 oz (90.357 kg)  SpO2: 97%    Body mass index is 30.3 kg/(m^2).  Physical Exam  Constitutional: Patient appears well-developed and well-nourished. Obese  No distress.  HEENT: head atraumatic, normocephalic, pupils equal and reactive to light, ears normal bilaterally, tender right maxillary sinus during percussion , neck supple, throat within normal limits Cardiovascular: Normal rate, regular rhythm and normal heart sounds.  No murmur heard. No BLE edema. Pulmonary/Chest: Effort normal and breath sounds normal. No respiratory distress. Abdominal: Soft.  There is no tenderness. Psychiatric: Patient has a normal mood and affect. behavior is normal. Judgment and thought content normal.  PHQ2/9: Depression screen [facility] 2/9 10/08/2015 05/07/2015  Decreased Interest 0 0  Down, Depressed, Hopeless 0 0  PHQ - 2 Score 0 0     Fall Risk: Fall Risk  10/08/2015 05/07/2015  Falls in the past year? No No    Functional Status Survey: Is the patient deaf or have difficulty hearing?: No Does the patient have difficulty seeing, even when wearing glasses/contacts?: No Does the patient have difficulty concentrating,  remembering, or making decisions?: No Does the patient have difficulty walking or climbing stairs?: No Does the patient have difficulty dressing or bathing?: No Does the patient have difficulty doing errands alone such as visiting a doctor's office or shopping?: No    Assessment & Plan  1. GERD without esophagitis  keep follow up with Dr. Markham Jordan, changed from [facility] to [facility] since it is the same steroid  2. Asthma, mild intermittent, with acute exacerbation  - albuterol (PROVENTIL HFA) 108 (90 Base) MCG/ACT inhaler; Inhale 2 puffs into the lungs every 4 (four) hours as needed.  Dispense: 18 g; Refill: 0 - fluticasone furoate-vilanterol (BREO ELLIPTA) 100-25 MCG/INH AEPB; Inhale 1 puff into the lungs daily.  Dispense: 60 each; Refill: 1  3. Right maxillary sinusitis  Advised saline spray - azithromycin (ZITHROMAX) 500 MG tablet; Take 1 tablet (500 mg total) by mouth daily.  Dispense: 3 tablet; Refill: 0  4. Arthralgia  Try to switch to Tylenol to protect her stomach - meloxicam (MOBIC) 15 MG tablet; Take 1 tablet (15 mg total) by mouth daily as needed.  Dispense: 90 tablet; Refill: 0  5. Major depression in remission (HCC)  In remission  - buPROPion (WELLBUTRIN XL) 150 MG 24 hr tablet; Take 1 tablet (150 mg total) by mouth daily.  Dispense: 90 tablet; Refill: 1 - escitalopram (LEXAPRO) 10 MG tablet; Take 1 tablet (10 mg total) by mouth daily.  Dispense: 90 tablet; Refill: 1  6. Dyslipidemia  - Lipid panel  7. B12 deficiency  - Vitamin B12  8. Long-term use of high-risk medication  - CBC with Differential/Platelet - Comprehensive metabolic panel  9. Weight gain  Likely secondary to feeling better since started to take sucralfate, but also has fatigue and gained over 20 lbs in 6 months we will check TSH - Thyroid Panel With TSH

## 2015-10-08 NOTE — Addendum Note (Signed)
Addended by: Alba Cory F on: 10/08/2015 04:28 PM   Modules accepted: Orders

## 2015-10-30 ENCOUNTER — Other Ambulatory Visit: Payer: Self-pay | Admitting: Family Medicine

## 2015-11-03 ENCOUNTER — Encounter: Payer: Self-pay | Admitting: Family Medicine

## 2016-01-11 ENCOUNTER — Emergency Department: Payer: Managed Care, Other (non HMO)

## 2016-01-11 ENCOUNTER — Emergency Department
Admission: EM | Admit: 2016-01-11 | Discharge: 2016-01-11 | Disposition: A | Discharge status: Home / Self Care | Payer: Managed Care, Other (non HMO) | Attending: Emergency Medicine | Admitting: Emergency Medicine

## 2016-01-11 DIAGNOSIS — Z79899 Other long term (current) drug therapy: Secondary | ICD-10-CM | POA: Insufficient documentation

## 2016-01-11 DIAGNOSIS — R1011 Right upper quadrant pain: Secondary | ICD-10-CM | POA: Diagnosis present

## 2016-01-11 DIAGNOSIS — R52 Pain, unspecified: Secondary | ICD-10-CM

## 2016-01-11 DIAGNOSIS — N23 Unspecified renal colic: Secondary | ICD-10-CM | POA: Diagnosis not present

## 2016-01-11 DIAGNOSIS — J45909 Unspecified asthma, uncomplicated: Secondary | ICD-10-CM | POA: Diagnosis not present

## 2016-01-11 DIAGNOSIS — Z791 Long term (current) use of non-steroidal anti-inflammatories (NSAID): Secondary | ICD-10-CM | POA: Diagnosis not present

## 2016-01-11 DIAGNOSIS — E785 Hyperlipidemia, unspecified: Secondary | ICD-10-CM | POA: Insufficient documentation

## 2016-01-11 DIAGNOSIS — F329 Major depressive disorder, single episode, unspecified: Secondary | ICD-10-CM | POA: Insufficient documentation

## 2016-01-11 LAB — URINALYSIS COMPLETE WITH MICROSCOPIC (ARMC ONLY)
Bilirubin Urine: NEGATIVE
Glucose, UA: NEGATIVE mg/dL
LEUKOCYTES UA: NEGATIVE
NITRITE: NEGATIVE
PH: 5 (ref 5.0–8.0)
PROTEIN: 100 mg/dL — AB
SPECIFIC GRAVITY, URINE: 1.029 (ref 1.005–1.030)

## 2016-01-11 LAB — COMPREHENSIVE METABOLIC PANEL
ALBUMIN: 4.5 g/dL (ref 3.5–5.0)
ALT: 54 U/L (ref 14–54)
ANION GAP: 7 (ref 5–15)
AST: 39 U/L (ref 15–41)
Alkaline Phosphatase: 76 U/L (ref 38–126)
BILIRUBIN TOTAL: 0.9 mg/dL (ref 0.3–1.2)
BUN: 20 mg/dL (ref 6–20)
CHLORIDE: 110 mmol/L (ref 101–111)
CO2: 25 mmol/L (ref 22–32)
Calcium: 9.3 mg/dL (ref 8.9–10.3)
Creatinine, Ser: 0.87 mg/dL (ref 0.44–1.00)
GFR calc Af Amer: >60 mL/min (ref >60)
Glucose, Bld: 112 mg/dL — ABNORMAL HIGH (ref 65–99)
POTASSIUM: 3.6 mmol/L (ref 3.5–5.1)
Sodium: 142 mmol/L (ref 135–145)
TOTAL PROTEIN: 7.6 g/dL (ref 6.5–8.1)

## 2016-01-11 LAB — CBC
HEMATOCRIT: 43.4 % (ref 35.0–47.0)
HEMOGLOBIN: 14.5 g/dL (ref 12.0–16.0)
MCH: 30.4 pg (ref 26.0–34.0)
MCHC: 33.5 g/dL (ref 32.0–36.0)
MCV: 90.8 fL (ref 80.0–100.0)
Platelets: 195 10*3/uL (ref 150–440)
RBC: 4.77 MIL/uL (ref 3.80–5.20)
RDW: 13 % (ref 11.5–14.5)
WBC: 7.1 10*3/uL (ref 3.6–11.0)

## 2016-01-11 LAB — LIPASE, BLOOD: LIPASE: 35 U/L (ref 11–51)

## 2016-01-11 MED ORDER — ONDANSETRON HCL 4 MG/2ML IJ SOLN: 4.0000 mg | Freq: Once | INTRAMUSCULAR | Status: DC

## 2016-01-11 MED ORDER — KETOROLAC TROMETHAMINE 30 MG/ML IJ SOLN
15.0000 mg | Freq: Once | INTRAMUSCULAR | Status: AC
  Administered 2016-01-11: 15 mg via INTRAVENOUS

## 2016-01-11 MED ORDER — HYDROMORPHONE HCL 1 MG/ML IJ SOLN: 0.5000 mg | Freq: Once | INTRAMUSCULAR | Status: DC

## 2016-01-11 MED ORDER — KETOROLAC TROMETHAMINE 30 MG/ML IJ SOLN
INTRAMUSCULAR | Status: AC
  Administered 2016-01-11: 15 mg via INTRAVENOUS
  Filled 2016-01-11: qty 1

## 2016-01-11 MED ORDER — OXYCODONE-ACETAMINOPHEN 5-325 MG PO TABS: 1.0000 | ORAL_TABLET | Freq: Four times a day (QID) | ORAL | Status: DC | PRN

## 2016-01-11 MED ORDER — SULFAMETHOXAZOLE-TRIMETHOPRIM 800-160 MG PO TABS: 1.0000 | ORAL_TABLET | Freq: Two times a day (BID) | ORAL | Status: DC

## 2016-01-11 NOTE — ED Notes (Signed)
Pt says she was awakened around 0230 with severe pain to right upper quadrant; cramping pain; says area now is tender on palpation; nausea when pain started but not presently; says she chewed 2 tums which provided very little relief; history of cholecystectomy

## 2016-01-11 NOTE — ED Notes (Signed)
MD at bedside. 

## 2016-01-11 NOTE — ED Notes (Signed)
Pt alert and oriented X4, active, cooperative, pt in NAD. RR even and unlabored, color WNL.  Pt informed to return if any life threatening symptoms occur.   

## 2016-01-11 NOTE — ED Provider Notes (Signed)
[facility] Emergency Department Provider Note   ____________________________________________  Time seen: Approximately 9:32 AM  I have reviewed the triage vital signs and the nursing notes.   HISTORY  Chief Complaint Abdominal Pain    HPI Jill Sampson is a 55 y.o. female who reports sudden onset of sharp severe pain in the right side early this morning. It has gotten continuously worse since then. Really does not radiate anywhere except for from the anterior axillary line around to the back. Does not move it is somewhat worse if she takes a deep breath. Nothing seems to make it better or worse besides that.   Past Medical History  Diagnosis Date  . GERD (gastroesophageal reflux disease)   . Anxiety   . Asthma   . Snoring   . Allergy   . Low back pain   . Dyslipidemia   . Vitamin B 12 deficiency   . Panic attack   . Chronic insomnia   . Depression   . History of melanoma   . Gastritis   . Esophagitis     Patient Active Problem List   Diagnosis Date Noted  . Gastritis 10/08/2015  . Hiatal hernia 05/07/2015  . Arthralgia 05/07/2015  . Insomnia, persistent 03/21/2015  . Chronic LBP 03/21/2015  . Dyslipidemia 03/21/2015  . GERD without esophagitis 03/21/2015  . H/O: hysterectomy 03/21/2015  . H/O Malignant melanoma 03/21/2015  . Depression, major, recurrent, mild (HCC) 03/21/2015  . Asthma, mild intermittent 03/21/2015  . Plantar fasciitis 03/21/2015  . B12 deficiency 03/21/2015  . Snores 03/21/2015  . Perennial allergic rhinitis with seasonal variation 03/21/2015    Past Surgical History  Procedure Laterality Date  . Abdominal hysterectomy    . Shoulder surgery Left   . Functional endoscopic sinus surgery Bilateral   . Refractive surgery  2007  . Cholecystectomy      Current Outpatient Rx  Name  Route  Sig  Dispense  Refill  . albuterol (PROVENTIL HFA) 108 (90 Base) MCG/ACT inhaler   Inhalation   Inhale 2 puffs into  the lungs every 4 (four) hours as needed.   18 g   0   . ALPRAZolam (XANAX) 0.5 MG tablet   Oral   Take 1 tablet (0.5 mg total) by mouth as needed.   30 tablet   0   . buPROPion (WELLBUTRIN XL) 150 MG 24 hr tablet   Oral   Take 1 tablet (150 mg total) by mouth daily.   90 tablet   1   . escitalopram (LEXAPRO) 10 MG tablet   Oral   Take 1 tablet (10 mg total) by mouth daily.   90 tablet   1   . esomeprazole (NEXIUM) 40 MG capsule      TAKE 1 CAPSULE BY MOUTH DAILY AT 12 NOON   90 capsule   1   . estrogen-methylTESTOSTERone (ESTRATEST) 1.25-2.5 MG tablet   Oral   Take 1 tablet by mouth daily.         . fluticasone (FLONASE) 50 MCG/ACT nasal spray   Each Nare   Place 2 sprays into both nostrils as needed.   48 g   1   . meloxicam (MOBIC) 15 MG tablet   Oral   Take 1 tablet (15 mg total) by mouth daily as needed.   90 tablet   0   . sucralfate (CARAFATE) 1 g tablet      TAKE 1 TABLET BY MOUTH TWICE A DAY BETWEEN  MEALS      2     Allergies Levofloxacin; Amoxicillin; and Penicillins  Family History  Problem Relation Age of Onset  . Alzheimer's disease Mother   . Hypertension Mother   . Stroke Mother   . Migraines Mother   . Heart attack Father   . Heart disease Father   . Cancer Sister     Breast  . Stroke Maternal Aunt   . Diabetes Maternal Uncle     Social History Social History  Substance Use Topics  . Smoking status: Never Smoker   . Smokeless tobacco: Never Used  . Alcohol Use: No    Review of Systems Constitutional: No fever/chills Eyes: No visual changes. ENT: No sore throat. Cardiovascular: Denies chest pain. Respiratory: Denies shortness of breath. Gastrointestinal: See history of present illness Genitourinary: Negative for dysuria. Musculoskeletal: Negative for back pain. Skin: Negative for rash. Neurological: Negative for headaches, focal weakness or numbness.  10-point ROS otherwise  negative.  ____________________________________________   PHYSICAL EXAM:  VITAL SIGNS: ED Triage Vitals  Enc Vitals Group     BP 01/11/16 0658 140/86 mmHg     Pulse Rate 01/11/16 0658 88     Resp 01/11/16 0658 18     Temp 01/11/16 0658 98.7 F (37.1 C)     Temp Source 01/11/16 0658 Oral     SpO2 01/11/16 0658 96 %     Weight 01/11/16 0658 175 lb (79.379 kg)     Height 01/11/16 0658 5\' 8"  (1.727 m)     Head Cir --      Peak Flow --      Pain Score 01/11/16 0658 6     Pain Loc --      Pain Edu? --      Excl. in GC? --     Constitutional: Alert and oriented. Well appearing and in no acute distress! Eyes: Conjunctivae are normal. PERRL. EOMI. Head: Atraumatic. Nose: No congestion/rhinnorhea. Mouth/Throat: Mucous membranes are moist.  Oropharynx non-erythematous. Neck: No stridor.  Cardiovascular: Normal rate, regular rhythm. Grossly normal heart sounds.  Good peripheral circulation. Respiratory: Normal respiratory effort.  No retractions. Lungs CTAB. Gastrointestinal: Soft Tender to palpation percussion right upper quadrant especially laterally. No distention. No abdominal bruits. No CVA tenderness. Musculoskeletal: No lower extremity tenderness nor edema.  No joint effusions. Neurologic:  Normal speech and language. No gross focal neurologic deficits are appreciated. No gait instability. Skin:  Skin is warm, dry and intact. No rash noted. Psychiatric: Mood and affect are normal. Speech and behavior are normal.  ____________________________________________   LABS (all labs ordered are listed, but only abnormal results are displayed)  Labs Reviewed  COMPREHENSIVE METABOLIC PANEL - Abnormal; Notable for the following:    Glucose, Bld 112 (*)    All other components within normal limits  URINALYSIS COMPLETEWITH MICROSCOPIC ([facility] ONLY) - Abnormal; Notable for the following:    Color, Urine AMBER (*)    APPearance CLOUDY (*)    Ketones, ur TRACE (*)    Hgb urine dipstick  3+ (*)    Protein, ur 100 (*)    Bacteria, UA RARE (*)    Squamous Epithelial / LPF 0-5 (*)    All other components within normal limits  LIPASE, BLOOD  CBC   ____________________________________________  EKG  EKG read and interpreted by me shows normal sinus rhythm at a rate of 78 normal axis no acute ST-T wave changes    ____________________________________________  RADIOLOGY  X-ray read by radiology sees  proximal ____________________________________________   PROCEDURES    ____________________________________________   INITIAL IMPRESSION / ASSESSMENT AND PLAN / ED COURSE  Pertinent labs & imaging results that were available during my care of the patient were reviewed by me and considered in my medical decision making (see chart for details).   ____________________________________________   FINAL CLINICAL IMPRESSION(S) / ED DIAGNOSES  Final diagnoses:  Pain  Renal colic      NEW MEDICATIONS STARTED DURING THIS VISIT:  Discharge Medication List as of 01/11/2016 10:25 AM    START taking these medications   Details  oxyCODONE-acetaminophen (ROXICET) 5-325 MG tablet Take 1 tablet by mouth every 6 (six) hours as needed., Starting 01/11/2016, Until Tue 01/10/17, Print    sulfamethoxazole-trimethoprim (BACTRIM DS,SEPTRA DS) 800-160 MG tablet Take 1 tablet by mouth 2 (two) times daily., Starting 01/11/2016, Until Discontinued, Print         Note:  This document was prepared using Dragon voice recognition software and may include unintentional dictation errors.        Arnaldo Natal, MD 01/12/16 2201

## 2016-01-11 NOTE — ED Notes (Signed)
Pt refuses pain medication. Given warm blankets and socks. Pt alert and oriented X4, active, cooperative, pt in NAD. RR even and unlabored, color WNL.

## 2016-02-01 ENCOUNTER — Ambulatory Visit: Payer: Managed Care, Other (non HMO) | Admitting: Urology

## 2016-02-25 ENCOUNTER — Other Ambulatory Visit: Payer: Self-pay

## 2016-02-25 MED ORDER — MONTELUKAST SODIUM 10 MG PO TABS: 10.0000 mg | ORAL_TABLET | Freq: Every day | ORAL | Status: DC

## 2016-02-25 NOTE — Telephone Encounter (Signed)
Patient requesting refill. 

## 2016-03-09 ENCOUNTER — Encounter: Payer: Self-pay | Admitting: Urology

## 2016-03-09 ENCOUNTER — Ambulatory Visit (INDEPENDENT_AMBULATORY_CARE_PROVIDER_SITE_OTHER): Payer: Managed Care, Other (non HMO) | Admitting: Urology

## 2016-03-09 VITALS — BP 109/73 | HR 61 | Ht 68.0 in | Wt 165.1 lb

## 2016-03-09 DIAGNOSIS — N132 Hydronephrosis with renal and ureteral calculous obstruction: Secondary | ICD-10-CM | POA: Diagnosis not present

## 2016-03-09 DIAGNOSIS — N201 Calculus of ureter: Secondary | ICD-10-CM

## 2016-03-09 DIAGNOSIS — R310 Gross hematuria: Secondary | ICD-10-CM | POA: Diagnosis not present

## 2016-03-09 LAB — MICROSCOPIC EXAMINATION

## 2016-03-09 LAB — URINALYSIS, COMPLETE
Bilirubin, UA: NEGATIVE
Glucose, UA: NEGATIVE
Ketones, UA: NEGATIVE
Nitrite, UA: NEGATIVE
PH UA: 7 (ref 5.0–7.5)
PROTEIN UA: NEGATIVE
Specific Gravity, UA: 1.015 (ref 1.005–1.030)
Urobilinogen, Ur: 0.2 mg/dL (ref 0.2–1.0)

## 2016-03-09 NOTE — Progress Notes (Signed)
03/09/2016 3:39 PM   Jill Sampson 12/29/1960 [id number]  Referring provider: Alba Cory, MD [address] [employer] [place], [place] [address]  Chief Complaint  Patient presents with  . Nephrolithiasis    referred by ER    HPI: Patient is a 55 year old Caucasian female who is referred by [facility] ED for nephrolithiasis.  Patient states the onset of the pain was one month ago.   Its onset was sudden and sharp.  The pain was 10/10.  She was afraid that it may be her appendix, so she sought treatment in the ED.   It lasted for several hours.  The pain was located in the right flank and radiated to the right groin.  Nothing made the pain better.   Nothing made the pain worse.  She did have gross hematuria, but she denies fevers, chills, nausea or vomiting.  In the ED, she received Toradol.  UA demonstrated TNTC RBC's/hpf.  .  Serum creatinine was 0.87.  Prior serum creatinine 3 years ago was 0.80..  Non-contrast CT noted punctate 1-2 mm proximal right ureteral stone with mild right hydronephrosis.  Today, She is still experiencing mild right-sided flank pain.  She states that she has passed 3 stones insert visit to the ER.  She states the right-sided flank pain is mild compared to what it was when she went to the emergency room.  She's not noted anything that makes the pain worse or better. She states the pain radiates down into the groin.  UA today demonstrates 3-10 RBCs/hpf.  She does not have a prior history of stones.  Her sister and nephew have stones.     PMH: Past Medical History:  Diagnosis Date  . Allergy   . Anxiety   . Asthma   . Chronic insomnia   . Depression   . Dyslipidemia   . Esophagitis   . Gastritis   . GERD (gastroesophageal reflux disease)   . History of melanoma   . Low back pain   . Panic attack   . Snoring   . Vitamin B 12 deficiency     Surgical History: Past Surgical History:  Procedure Laterality Date  . ABDOMINAL HYSTERECTOMY     . CHOLECYSTECTOMY    . FUNCTIONAL ENDOSCOPIC SINUS SURGERY Bilateral   . REFRACTIVE SURGERY  2007  . SHOULDER SURGERY Left     Home Medications:    Medication List       Accurate as of 03/09/16  3:39 PM. Always use your most recent med list.          albuterol 108 (90 Base) MCG/ACT inhaler Commonly known as:  PROVENTIL HFA Inhale 2 puffs into the lungs every 4 (four) hours as needed.   ALPRAZolam 0.5 MG tablet Commonly known as:  XANAX Take 1 tablet (0.5 mg total) by mouth as needed.   buPROPion 150 MG 24 hr tablet Commonly known as:  WELLBUTRIN XL Take 1 tablet (150 mg total) by mouth daily.   escitalopram 10 MG tablet Commonly known as:  LEXAPRO Take 1 tablet (10 mg total) by mouth daily.   esomeprazole 40 MG capsule Commonly known as:  NEXIUM TAKE 1 CAPSULE BY MOUTH DAILY AT 12 NOON   estrogen-methylTESTOSTERone 1.25-2.5 MG tablet Commonly known as:  ESTRATEST Take 1 tablet by mouth daily.       Allergies:  Allergies  Allergen Reactions  . Levofloxacin     rash  . Amoxicillin Rash  . Penicillins Rash  Family History: Family History  Problem Relation Age of Onset  . Alzheimer's disease Mother   . Hypertension Mother   . Stroke Mother   . Migraines Mother   . Heart attack Father   . Heart disease Father   . Cancer Sister     Breast  . Stroke Maternal Aunt   . Diabetes Maternal Uncle   . Kidney cancer Neg Hx   . Prostate cancer Neg Hx     Social History:  reports that she has never smoked. She has never used smokeless tobacco. She reports that she does not drink alcohol. Her drug history is not on file.  ROS: UROLOGY Frequent Urination?: No Hard to postpone urination?: No Burning/pain with urination?: No Get up at night to urinate?: No Leakage of urine?: No Urine stream starts and stops?: No Trouble starting stream?: No Do you have to strain to urinate?: No Blood in urine?: No Urinary tract infection?: No Sexually transmitted  disease?: No Injury to kidneys or bladder?: No Painful intercourse?: No Weak stream?: No Currently pregnant?: No Vaginal bleeding?: No Last menstrual period?: n  Gastrointestinal Nausea?: No Vomiting?: No Indigestion/heartburn?: No Diarrhea?: No Constipation?: No  Constitutional Fever: No Night sweats?: Yes Weight loss?: No Fatigue?: No  Skin Skin rash/lesions?: No Itching?: No  Eyes Blurred vision?: No Double vision?: No  Ears/Nose/Throat Sore throat?: No Sinus problems?: Yes  Hematologic/Lymphatic Swollen glands?: No Easy bruising?: No  Cardiovascular Leg swelling?: No Chest pain?: No  Respiratory Cough?: No Shortness of breath?: No  Endocrine Excessive thirst?: No  Musculoskeletal Back pain?: Yes Joint pain?: No  Neurological Headaches?: No Dizziness?: No  Psychologic Depression?: Yes Anxiety?: Yes  Physical Exam: BP 109/73   Pulse 61   Ht 5\' 8"  (1.727 m)   Wt 165 lb 1.6 oz (74.9 kg)   BMI 25.10 kg/m   Constitutional: Well nourished. Alert and oriented, No acute distress. HEENT: [place] AT, moist mucus membranes. Trachea midline, no masses. Cardiovascular: No clubbing, cyanosis, or edema. Respiratory: Normal respiratory effort, no increased work of breathing. GI: Abdomen is soft, non tender, non distended, no abdominal masses. Liver and spleen not palpable.  No hernias appreciated.  Stool sample for occult testing is not indicated.   GU: No CVA tenderness.  No bladder fullness or masses.   Skin: No rashes, bruises or suspicious lesions. Lymph: No cervical or inguinal adenopathy. Neurologic: Grossly intact, no focal deficits, moving all 4 extremities. Psychiatric: Normal mood and affect.  Laboratory Data: Lab Results  Component Value Date   WBC 7.1 01/11/2016   HGB 14.5 01/11/2016   HCT 43.4 01/11/2016   MCV 90.8 01/11/2016   PLT 195 01/11/2016    Lab Results  Component Value Date   CREATININE 0.87 01/11/2016      Lab  Results  Component Value Date   AST 39 01/11/2016   Lab Results  Component Value Date   ALT 54 01/11/2016     Urinalysis Significant for 3-10RBC's/hpf.  See EPIC.   Pertinent Imaging: CLINICAL DATA:  Right upper quadrant pain.  Nausea.  EXAM: CT ABDOMEN AND PELVIS WITHOUT CONTRAST  TECHNIQUE: Multidetector CT imaging of the abdomen and pelvis was performed following the standard protocol without IV contrast.  COMPARISON:  11/26/2009  FINDINGS: Lower chest: Lung bases are clear. No effusions. Heart is normal size.  Hepatobiliary: Prior cholecystectomy.  No focal hepatic abnormality.  Pancreas: No focal abnormality or ductal dilatation.  Spleen: No focal abnormality.  Normal size.  Adrenals/Urinary Tract: Punctate 1-2  mm proximal right ureteral stone with mild right hydronephrosis. No renal or ureteral stones on the left. Urinary bladder and adrenal glands unremarkable.  Stomach/Bowel: Appendix is normal. Scattered sigmoid diverticula. No active diverticulitis. Stomach and small bowel decompressed.  Vascular/Lymphatic: No evidence of aneurysm or adenopathy.  Reproductive: Prior hysterectomy.  No adnexal masses.  Other: No free fluid or free air.  Musculoskeletal: No acute bony abnormality or focal bone lesion.  IMPRESSION: Punctate 1-2 mm proximal right ureteral stone with mild right hydronephrosis.   Electronically Signed   By: Charlett Nose M.D.   On: 01/11/2016 09:06   Assessment & Plan:    1. Right ureteral stone  -patient passed three stones one month ago  -still having right renal colic, will obtain a RUS  -instructed to call the office or seek care in the ED for fevers, chills, intractable pain and/or nausea for possible                             emergent intervention  - Urinalysis, Complete  - CULTURE, URINE COMPREHENSIVE  2. Right hydronephrosis  - hydronephrosis due to an ureteral stone, obtain RUS   3. Gross  hematuria  - UA today demonstrates 3-10 RBC's/hpf  - continue to monitor after resolution of stone passage  - if hematuria persists, may need hematuria work up    Return for RUS report.  These notes generated with voice recognition software. I apologize for typographical errors.  Michiel Cowboy, PA-C  [facility] [address], [address] [place], [place] [address] [phone]

## 2016-03-11 LAB — CULTURE, URINE COMPREHENSIVE

## 2016-03-15 ENCOUNTER — Ambulatory Visit: Payer: Managed Care, Other (non HMO) | Admitting: Urology

## 2016-03-16 ENCOUNTER — Ambulatory Visit
Admission: RE | Admit: 2016-03-16 | Discharge: 2016-03-16 | Disposition: A | Discharge status: Home / Self Care | Payer: Managed Care, Other (non HMO) | Source: Ambulatory Visit | Attending: Urology | Admitting: Urology

## 2016-03-16 DIAGNOSIS — N132 Hydronephrosis with renal and ureteral calculous obstruction: Secondary | ICD-10-CM

## 2016-03-16 DIAGNOSIS — R310 Gross hematuria: Secondary | ICD-10-CM | POA: Diagnosis not present

## 2016-03-17 ENCOUNTER — Ambulatory Visit: Payer: Managed Care, Other (non HMO) | Admitting: Urology

## 2016-03-18 ENCOUNTER — Telehealth: Payer: Self-pay | Admitting: Urology

## 2016-03-18 NOTE — Telephone Encounter (Signed)
Patient called and asked if you would give her the RUS results over the phone? She said if they are negative she doesn't want to have to come in and pay a copay if she doesn't need to.   Jill DusterMichelle

## 2016-03-18 NOTE — Telephone Encounter (Signed)
I wanted to check her urine again to make sure she no longer has blood in it.  That's why I wanted her to come back in.

## 2016-03-21 ENCOUNTER — Encounter: Payer: Self-pay | Admitting: Urology

## 2016-03-21 ENCOUNTER — Ambulatory Visit (INDEPENDENT_AMBULATORY_CARE_PROVIDER_SITE_OTHER): Payer: Managed Care, Other (non HMO) | Admitting: Urology

## 2016-03-21 VITALS — BP 148/78 | HR 60 | Ht 68.0 in | Wt 189.5 lb

## 2016-03-21 DIAGNOSIS — N201 Calculus of ureter: Secondary | ICD-10-CM

## 2016-03-21 DIAGNOSIS — N132 Hydronephrosis with renal and ureteral calculous obstruction: Secondary | ICD-10-CM

## 2016-03-21 DIAGNOSIS — R300 Dysuria: Secondary | ICD-10-CM | POA: Diagnosis not present

## 2016-03-21 DIAGNOSIS — R310 Gross hematuria: Secondary | ICD-10-CM

## 2016-03-21 MED ORDER — NITROFURANTOIN MONOHYD MACRO 100 MG PO CAPS: 100.0000 mg | ORAL_CAPSULE | Freq: Two times a day (BID) | ORAL | 0 refills | Status: DC

## 2016-03-21 NOTE — Telephone Encounter (Signed)
[name] for pt that she needed to keep her appt.  Jill Sampson

## 2016-03-21 NOTE — Progress Notes (Signed)
03/21/2016 5:05 PM   Jill Sampson 08/02/1961 [id number]  Referring provider: Alba CoryKrichna Sowles, MD [address] [place], [place] [address]  Chief Complaint  Patient presents with  . Results    RUS    HPI: Patient is a 55 year old Caucasian female who presents today for renal ultrasound results.    Background history Patient states the onset of the pain was one month ago.   Its onset was sudden and sharp.  The pain was 10/10.  She was afraid that it may be her appendix, so she sought treatment in the ED.   It lasted for several hours.  The pain was located in the right flank and radiated to the right groin.  Nothing made the pain better.   Nothing made the pain worse.  She did have gross hematuria, but she denies fevers, chills, nausea or vomiting.  In the ED, she received Toradol.  UA demonstrated TNTC RBC's/hpf.  .  Serum creatinine was 0.87.  Prior serum creatinine 3 years ago was 0.80.  Non-contrast CT noted punctate 1-2 mm proximal right ureteral stone with mild right hydronephrosis.  She does not have a prior history of stones.  Her sister and nephew have stones.   Today, she is experiencing lower back pain.  She states it started two days ago.  It is a deep, dull ache.  She is also experiencing burning after urination.   She's not noted anything that makes the pain worse or better.  The low back pain reaches levels of 8/10.  UA today demonstrates 3-10 RBCs/hpf and 6-10 WBC's/hpf.    Renal ultrasound performed on 03/16/2016 noted resolution of the right hydronephrosis.  I have personally reviewed the films.       PMH: Past Medical History:  Diagnosis Date  . Allergy   . Anxiety   . Asthma   . Chronic insomnia   . Depression   . Dyslipidemia   . Esophagitis   . Gastritis   . GERD (gastroesophageal reflux disease)   . History of melanoma   . Low back pain   . Panic attack   . Snoring   . Vitamin B 12 deficiency     Surgical History: Past  Surgical History:  Procedure Laterality Date  . ABDOMINAL HYSTERECTOMY    . CHOLECYSTECTOMY    . FUNCTIONAL ENDOSCOPIC SINUS SURGERY Bilateral   . REFRACTIVE SURGERY  2007  . SHOULDER SURGERY Left     Home Medications:    Medication List       Accurate as of 03/21/16  5:05 PM. Always use your most recent med list.          albuterol 108 (90 Base) MCG/ACT inhaler Commonly known as:  PROVENTIL HFA Inhale 2 puffs into the lungs every 4 (four) hours as needed.   ALPRAZolam 0.5 MG tablet Commonly known as:  XANAX Take 1 tablet (0.5 mg total) by mouth as needed.   buPROPion 150 MG 24 hr tablet Commonly known as:  WELLBUTRIN XL Take 1 tablet (150 mg total) by mouth daily.   DYMISTA 137-50 MCG/ACT Susp Generic drug:  Azelastine-Fluticasone PLACE 1 SPRAY INTO EACH NOSTRIL TWICE DAILY   escitalopram 10 MG tablet Commonly known as:  LEXAPRO Take 1 tablet (10 mg total) by mouth daily.   esomeprazole 40 MG capsule Commonly known as:  NEXIUM TAKE 1 CAPSULE BY MOUTH DAILY AT 12 NOON   estrogen-methylTESTOSTERone 1.25-2.5 MG tablet Commonly known as:  ESTRATEST Take  1 tablet by mouth daily.   nitrofurantoin (macrocrystal-monohydrate) 100 MG capsule Commonly known as:  MACROBID Take 1 capsule (100 mg total) by mouth every 12 (twelve) hours.       Allergies:  Allergies  Allergen Reactions  . Levofloxacin     rash  . Amoxicillin Rash  . Penicillins Rash    Family History: Family History  Problem Relation Age of Onset  . Alzheimer's disease Mother   . Hypertension Mother   . Stroke Mother   . Migraines Mother   . Heart attack Father   . Heart disease Father   . Cancer Sister     Breast  . Stroke Maternal Aunt   . Diabetes Maternal Uncle   . Kidney cancer Neg Hx   . Prostate cancer Neg Hx     Social History:  reports that she has never smoked. She has never used smokeless tobacco. She reports that she does not drink alcohol. Her drug history is not on  file.  ROS: UROLOGY Frequent Urination?: No Hard to postpone urination?: No Burning/pain with urination?: No Get up at night to urinate?: No Leakage of urine?: No Urine stream starts and stops?: No Trouble starting stream?: No Do you have to strain to urinate?: No Blood in urine?: No Urinary tract infection?: No Sexually transmitted disease?: No Injury to kidneys or bladder?: No Painful intercourse?: No Weak stream?: No Currently pregnant?: No Vaginal bleeding?: No Last menstrual period?: n  Gastrointestinal Nausea?: No Vomiting?: No Indigestion/heartburn?: No Diarrhea?: No Constipation?: No  Constitutional Fever: No Night sweats?: No Weight loss?: No Fatigue?: No  Skin Skin rash/lesions?: No Itching?: No  Eyes Blurred vision?: No Double vision?: No  Ears/Nose/Throat Sore throat?: No Sinus problems?: Yes  Hematologic/Lymphatic Swollen glands?: No Easy bruising?: No  Cardiovascular Leg swelling?: No Chest pain?: No  Respiratory Cough?: No Shortness of breath?: No  Endocrine Excessive thirst?: No  Musculoskeletal Back pain?: No Joint pain?: No  Neurological Headaches?: No Dizziness?: No  Psychologic Depression?: No Anxiety?: No  Physical Exam: BP (!) 148/78   Pulse 60   Ht 5\' 8"  (1.727 m)   Wt 189 lb 8 oz (86 kg)   BMI 28.81 kg/m   Constitutional: Well nourished. Alert and oriented, No acute distress. HEENT: [place] AT, moist mucus membranes. Trachea midline, no masses. Cardiovascular: No clubbing, cyanosis, or edema. Respiratory: Normal respiratory effort, no increased work of breathing. GI: Abdomen is soft, non tender, non distended, no abdominal masses. Liver and spleen not palpable.  No hernias appreciated.  Stool sample for occult testing is not indicated.   GU: No CVA tenderness.  No bladder fullness or masses.   Skin: No rashes, bruises or suspicious lesions. Lymph: No cervical or inguinal adenopathy. Neurologic: Grossly  intact, no focal deficits, moving all 4 extremities. Psychiatric: Normal mood and affect.  Laboratory Data: Lab Results  Component Value Date   WBC 7.1 01/11/2016   HGB 14.5 01/11/2016   HCT 43.4 01/11/2016   MCV 90.8 01/11/2016   PLT 195 01/11/2016    Lab Results  Component Value Date   CREATININE 0.87 01/11/2016    Lab Results  Component Value Date   AST 39 01/11/2016   Lab Results  Component Value Date   ALT 54 01/11/2016     Urinalysis Significant for 3-10RBC's/hpf.  See EPIC.   Pertinent Imaging: CLINICAL DATA:  Hydronephrosis identified on CT exam. Right flank pain for 2 months. Ureteral calculus.  EXAM: RENAL / URINARY TRACT ULTRASOUND COMPLETE  COMPARISON:  01/11/2016  FINDINGS: Right Kidney:  Length: 11.1 cm. Echogenicity within normal limits. No mass or hydronephrosis visualized.  Left Kidney:  Length: 11.0 cm. Echogenicity within normal limits. No mass or hydronephrosis visualized.  Bladder:  Appears normal for degree of bladder distention. Bilateral ureteral jets are visualized.  IMPRESSION: Normal appearance of the kidneys and bladder. No hydronephrosis identified on today's exam.   Electronically Signed   By: Norva PavlovElizabeth  Brown M.D.   On: 03/16/2016 17:46   Assessment & Plan:    1. Right ureteral stone  -patient passed three stones one month ago  -RUS has noted resolution of the hydronephrosis  -instructed to call the office or seek care in the ED for fevers, chills, intractable pain and/or nausea for   possible emergent intervention  2. Dysuria  - UA suspicious for infection, started nitrofurantoin due to being symptomatic  - Urinalysis, Complete  - CULTURE, URINE COMPREHENSIVE  3. Right hydronephrosis  - resolved, confirmed by RUS  4. Gross hematuria  - UA today demonstrates 3-10 RBC's/hpf, but suspicious for infection, sent for culture  - continue to monitor after resolution of stone passage  - if hematuria  persists, may need hematuria work up    Return for pending urine culture results.  These notes generated with voice recognition software. I apologize for typographical errors.  Michiel CowboySHANNON Emrys Mceachron, PA-C  [facility] [address], [address] [place], [place] [address] [phone]

## 2016-03-22 LAB — MICROSCOPIC EXAMINATION: Epithelial Cells (non renal): >10 /hpf — AB (ref 0–10)

## 2016-03-22 LAB — URINALYSIS, COMPLETE
Bilirubin, UA: NEGATIVE
GLUCOSE, UA: NEGATIVE
Ketones, UA: NEGATIVE
NITRITE UA: NEGATIVE
PH UA: 6 (ref 5.0–7.5)
PROTEIN UA: NEGATIVE
Specific Gravity, UA: 1.01 (ref 1.005–1.030)
UUROB: 0.2 mg/dL (ref 0.2–1.0)

## 2016-03-25 ENCOUNTER — Telehealth: Payer: Self-pay

## 2016-03-25 LAB — CULTURE, URINE COMPREHENSIVE

## 2016-03-25 NOTE — Telephone Encounter (Signed)
LMOM- most recent labs are negative.  

## 2016-03-25 NOTE — Telephone Encounter (Signed)
-----   Message from Harle BattiestShannon A McGowan, PA-C sent at 03/25/2016  8:31 AM EDT ----- Please notify the patient that her urine culture is negative.

## 2016-03-28 ENCOUNTER — Other Ambulatory Visit: Payer: Self-pay | Admitting: Family Medicine

## 2016-03-28 DIAGNOSIS — F325 Major depressive disorder, single episode, in full remission: Secondary | ICD-10-CM

## 2016-04-07 ENCOUNTER — Other Ambulatory Visit: Payer: Self-pay | Admitting: Family Medicine

## 2016-04-07 DIAGNOSIS — F325 Major depressive disorder, single episode, in full remission: Secondary | ICD-10-CM

## 2016-04-12 ENCOUNTER — Ambulatory Visit (INDEPENDENT_AMBULATORY_CARE_PROVIDER_SITE_OTHER): Payer: Managed Care, Other (non HMO) | Admitting: Family Medicine

## 2016-04-12 ENCOUNTER — Encounter: Payer: Self-pay | Admitting: Family Medicine

## 2016-04-12 VITALS — BP 128/82 | HR 66 | Temp 98.2°F | Resp 16 | Ht 68.0 in | Wt 188.3 lb

## 2016-04-12 DIAGNOSIS — K449 Diaphragmatic hernia without obstruction or gangrene: Secondary | ICD-10-CM

## 2016-04-12 DIAGNOSIS — J309 Allergic rhinitis, unspecified: Secondary | ICD-10-CM | POA: Diagnosis not present

## 2016-04-12 DIAGNOSIS — E785 Hyperlipidemia, unspecified: Secondary | ICD-10-CM | POA: Diagnosis not present

## 2016-04-12 DIAGNOSIS — K219 Gastro-esophageal reflux disease without esophagitis: Secondary | ICD-10-CM | POA: Diagnosis not present

## 2016-04-12 DIAGNOSIS — J3089 Other allergic rhinitis: Secondary | ICD-10-CM

## 2016-04-12 DIAGNOSIS — J4521 Mild intermittent asthma with (acute) exacerbation: Secondary | ICD-10-CM

## 2016-04-12 DIAGNOSIS — Z131 Encounter for screening for diabetes mellitus: Secondary | ICD-10-CM | POA: Diagnosis not present

## 2016-04-12 DIAGNOSIS — F325 Major depressive disorder, single episode, in full remission: Secondary | ICD-10-CM

## 2016-04-12 DIAGNOSIS — J0101 Acute recurrent maxillary sinusitis: Secondary | ICD-10-CM

## 2016-04-12 DIAGNOSIS — E538 Deficiency of other specified B group vitamins: Secondary | ICD-10-CM

## 2016-04-12 DIAGNOSIS — J302 Other seasonal allergic rhinitis: Secondary | ICD-10-CM

## 2016-04-12 DIAGNOSIS — Z87442 Personal history of urinary calculi: Secondary | ICD-10-CM | POA: Diagnosis not present

## 2016-04-12 DIAGNOSIS — G4700 Insomnia, unspecified: Secondary | ICD-10-CM

## 2016-04-12 DIAGNOSIS — Z23 Encounter for immunization: Secondary | ICD-10-CM

## 2016-04-12 DIAGNOSIS — Z79899 Other long term (current) drug therapy: Secondary | ICD-10-CM | POA: Diagnosis not present

## 2016-04-12 MED ORDER — QUETIAPINE FUMARATE 25 MG PO TABS: 25.0000 mg | ORAL_TABLET | Freq: Every day | ORAL | 0 refills | Status: DC

## 2016-04-12 MED ORDER — BUPROPION HCL ER (XL) 300 MG PO TB24: 300.0000 mg | ORAL_TABLET | Freq: Every day | ORAL | 0 refills | Status: DC

## 2016-04-12 MED ORDER — MOMETASONE FURO-FORMOTEROL FUM 100-5 MCG/ACT IN AERO: 2.0000 | INHALATION_SPRAY | Freq: Two times a day (BID) | RESPIRATORY_TRACT | 0 refills | Status: DC

## 2016-04-12 MED ORDER — AZITHROMYCIN 500 MG PO TABS
ORAL_TABLET | ORAL | 0 refills | Status: DC
Start: 2016-04-12 — End: 2016-04-20

## 2016-04-12 MED ORDER — ESOMEPRAZOLE MAGNESIUM 40 MG PO CPDR
DELAYED_RELEASE_CAPSULE | ORAL | 1 refills | Status: DC
Start: 2016-04-12 — End: 2016-05-22

## 2016-04-12 NOTE — Progress Notes (Signed)
Name: Jill Sampson   MRN: [id number]    DOB: 03-Aug-1961   Date:04/12/2016       Progress Note  Subjective  Chief Complaint  Chief Complaint  Patient presents with  . Medication Refill    6 month F/U  . Depression    Patient bumped up her Wellbutrin to 300 mg due to feeling off and needed a booster and stopped the Lexapro due to not wanting all that in her system. Since stopping Lexapro her sleep has been very disturbed.   . Gastroesophageal Reflux    Well controlled   . Sinus Problem    Onset-12 days, patient states her symptoms are just getting worst and is having Facial Pain, sinus drainage, runny nose. Patient has been taking Allegra otc with no relief.     HPI  GERD with esophagitis: she went to see Dr. Markham Jordan and had EGD, hiatal hernia not severe enough for surgery, but she has esophagitis and gastritis, was given carafate, still on Nexium and she was given Flovent. She states symptoms have improved. She is off Flovent, but is still taking Carafate, explained that Carafate is usually given for short term and she should contact Dr. Mechele Collin.   Asthma mild intermittent: she states that for the past few days  she has noticed mild SOB, no wheezing, cough from post-nasal drainage. She states Elwin Sleight has worked well for her in the past and would like a refill.   Sinusitis: she has seen Dr. Jenne Campus in the past, she is allergic to mold, using allergy medication and neti pot, but has noticed that over the past 10 days she has noticed nasal congestion, post-nasal drainage, facial pressure, and cough.   Arthralgia: states that for the past couple of years she wakes up feeling stiff all over, and lasts about 1 hour to feel better. Also has generalized joint aches and takes Meloxicam to control symptoms every other day. No redness , no swelling. No family of RF or autoimmune disease. Explained that NSAID"s are not good for patient with GERD/esophagitis and she will try Tylenol instead.    Major Depression in Remission: she has a long history of depression, she was doing well, but recently she decided to go up on dose of Wellbutrin to 300 mg and stopped taking Lexapro. She states she likes the way it is except that the higher dose of Wellbutrin has affected her ability to fall asleep, discussed options and we will try Seroquel.    Patient Active Problem List   Diagnosis Date Noted  . History of kidney stones 04/12/2016  . Gastritis 10/08/2015  . Hiatal hernia 05/07/2015  . Arthralgia 05/07/2015  . Insomnia, persistent 03/21/2015  . Chronic LBP 03/21/2015  . Dyslipidemia 03/21/2015  . GERD without esophagitis 03/21/2015  . H/O: hysterectomy 03/21/2015  . H/O Malignant melanoma 03/21/2015  . Depression, major, recurrent, mild (HCC) 03/21/2015  . Asthma, mild intermittent 03/21/2015  . Plantar fasciitis 03/21/2015  . B12 deficiency 03/21/2015  . Snores 03/21/2015  . Perennial allergic rhinitis with seasonal variation 03/21/2015  . Hyperlipidemia 03/21/2015    Past Surgical History:  Procedure Laterality Date  . ABDOMINAL HYSTERECTOMY    . CHOLECYSTECTOMY    . FUNCTIONAL ENDOSCOPIC SINUS SURGERY Bilateral   . REFRACTIVE SURGERY  2007  . SHOULDER SURGERY Left     Family History  Problem Relation Age of Onset  . Alzheimer's disease Mother   . Hypertension Mother   . Stroke Mother   . Migraines Mother   .  Heart attack Father   . Heart disease Father   . Cancer Sister     Breast  . Stroke Maternal Aunt   . Diabetes Maternal Uncle   . Kidney cancer Neg Hx   . Prostate cancer Neg Hx     Social History   Social History  . Marital status: Single    Spouse name: N/A  . Number of children: N/A  . Years of education: N/A   Occupational History  . Not on file.   Social History Main Topics  . Smoking status: Never Smoker  . Smokeless tobacco: Never Used  . Alcohol use No  . Drug use: Unknown  . Sexual activity: Yes   Other Topics Concern  . Not  on file   Social History Narrative  . No narrative on file     Current Outpatient Prescriptions:  .  ALPRAZolam (XANAX) 0.5 MG tablet, Take 1 tablet (0.5 mg total) by mouth as needed. (Patient not taking: Reported on 03/09/2016), Disp: 30 tablet, Rfl: 0 .  buPROPion (WELLBUTRIN XL) 150 MG 24 hr tablet, TAKE 1 TABLET (150 MG TOTAL) BY MOUTH DAILY., Disp: 90 tablet, Rfl: 0 .  esomeprazole (NEXIUM) 40 MG capsule, TAKE 1 CAPSULE BY MOUTH DAILY AT 12 NOON, Disp: 90 capsule, Rfl: 1 .  estrogen-methylTESTOSTERone (ESTRATEST) 1.25-2.5 MG tablet, Take 1 tablet by mouth daily., Disp: , Rfl:  .  meloxicam (MOBIC) 15 MG tablet, , Disp: , Rfl:  .  sucralfate (CARAFATE) 1 g tablet, , Disp: , Rfl:   Allergies  Allergen Reactions  . Levofloxacin     rash  . Amoxicillin Rash  . Penicillins Rash     ROS  Ten systems reviewed and is negative except as mentioned in HPI  Objective  Vitals:   04/12/16 1623  BP: 128/82  Pulse: 66  Resp: 16  Temp: 98.2 F (36.8 C)  TempSrc: Oral  SpO2: 97%  Weight: 188 lb 4.8 oz (85.4 kg)  Height: 5\' 8"  (1.727 m)    Body mass index is 28.63 kg/m.  Physical Exam  Constitutional: Patient appears well-developed and well-nourished. Obese  No distress.  HEENT: head atraumatic, normocephalic, pupils equal and reactive to light, ears normal TM bilaterally , neck supple, throat within normal limits, tender bilateral maxillary sinus during percussion, boggy turbinates Cardiovascular: Normal rate, regular rhythm and normal heart sounds.  No murmur heard. No BLE edema. Pulmonary/Chest: Effort normal and breath sounds normal. No respiratory distress. Abdominal: Soft.  There is no tenderness. Psychiatric: Patient has a normal mood and affect. behavior is normal. Judgment and thought content normal.   Recent Results (from the past 2160 hour(s))  Urinalysis, Complete     Status: Abnormal   Collection Time: 03/09/16  3:36 PM  Result Value Ref Range   Specific  Gravity, UA 1.015 1.005 - 1.030   pH, UA 7.0 5.0 - 7.5   Color, UA Yellow Yellow   Appearance Ur Cloudy (A) Clear   Leukocytes, UA Trace (A) Negative   Protein, UA Negative Negative/Trace   Glucose, UA Negative Negative   Ketones, UA Negative Negative   RBC, UA Trace (A) Negative   Bilirubin, UA Negative Negative   Urobilinogen, Ur 0.2 0.2 - 1.0 mg/dL   Nitrite, UA Negative Negative   Microscopic Examination See below:   CULTURE, URINE COMPREHENSIVE     Status: None   Collection Time: 03/09/16  3:36 PM  Result Value Ref Range   Urine Culture, Comprehensive Final report  Result 1 Comment     Comment: Mixed urogenital flora 5,000  Colonies/mL   Microscopic Examination     Status: Abnormal   Collection Time: 03/09/16  3:36 PM  Result Value Ref Range   WBC, UA 0-5 0 - 5 /hpf   RBC, UA 3-10 (A) 0 - 2 /hpf   Epithelial Cells (non renal) >10 (A) 0 - 10 /hpf   Bacteria, UA Few None seen/Few  Urinalysis, Complete     Status: Abnormal   Collection Time: 03/21/16  4:34 PM  Result Value Ref Range   Specific Gravity, UA 1.010 1.005 - 1.030   pH, UA 6.0 5.0 - 7.5   Color, UA Yellow Yellow   Appearance Ur Cloudy (A) Clear   Leukocytes, UA Trace (A) Negative   Protein, UA Negative Negative/Trace   Glucose, UA Negative Negative   Ketones, UA Negative Negative   RBC, UA Trace (A) Negative   Bilirubin, UA Negative Negative   Urobilinogen, Ur 0.2 0.2 - 1.0 mg/dL   Nitrite, UA Negative Negative   Microscopic Examination See below:   Microscopic Examination     Status: Abnormal   Collection Time: 03/21/16  4:34 PM  Result Value Ref Range   WBC, UA 6-10 (A) 0 - 5 /hpf   RBC, UA 3-10 (A) 0 - 2 /hpf   Epithelial Cells (non renal) >10 (A) 0 - 10 /hpf   Bacteria, UA Few None seen/Few  CULTURE, URINE COMPREHENSIVE     Status: None   Collection Time: 03/21/16  4:47 PM  Result Value Ref Range   Urine Culture, Comprehensive Final report    Result 1 Comment     Comment: Mixed urogenital  flora 4,000 Colonies/mL      PHQ2/9: Depression screen [facility] 2/9 04/12/2016 10/08/2015 05/07/2015  Decreased Interest 0 0 0  Down, Depressed, Hopeless 0 0 0  PHQ - 2 Score 0 0 0    Fall Risk: Fall Risk  04/12/2016 10/08/2015 05/07/2015  Falls in the past year? No No No     Functional Status Survey: Is the patient deaf or have difficulty hearing?: No Does the patient have difficulty seeing, even when wearing glasses/contacts?: No Does the patient have difficulty concentrating, remembering, or making decisions?: No Does the patient have difficulty walking or climbing stairs?: No Does the patient have difficulty dressing or bathing?: No Does the patient have difficulty doing errands alone such as visiting a doctor's office or shopping?: No    Assessment & Plan  1. GERD without esophagitis  - esomeprazole (NEXIUM) 40 MG capsule; TAKE 1 CAPSULE BY MOUTH DAILY AT 12 NOON  Dispense: 90 capsule; Refill: 1 Sees Dr. Mechele Collin  2. Asthma, mild intermittent, with acute exacerbation  - mometasone-formoterol (DULERA) 100-5 MCG/ACT AERO; Inhale 2 puffs into the lungs 2 (two) times daily.  Dispense: 1 Inhaler; Refill: 0  3. Major depression in remission (HCC)  - buPROPion (WELLBUTRIN XL) 300 MG 24 hr tablet; Take 1 tablet (300 mg total) by mouth daily.  Dispense: 90 tablet; Refill: 0  4. Dyslipidemia  - Lipid panel  5. B12 deficiency  - Vitamin B12  6. Hiatal hernia   7. Insomnia, persistent  - QUEtiapine (SEROQUEL) 25 MG tablet; Take 1 tablet (25 mg total) by mouth at bedtime.  Dispense: 90 tablet; Refill: 0  8. Perennial allergic rhinitis with seasonal variation  Having worsening of symptoms versus URI, needs to use nasal spray and continue anti-histamines  9. History of kidney stones  Seen  by Urologist, and last US showed it had passed, no symptoms since  10. Acute recurrent maxillary sinusitis  - azithromycin (ZITHROMAX) 500 MG tablet; Take one daily  Dispense: 3 tablet;  Refill: 0  11. Diabetes mellitus screening  - Hemoglobin A1c  12. Long-term use of high-risk medication  - COMPLETE METABOLIC PANEL WITH GFR  13. Needs flu shot  - Flu Vaccine QUAD 36+ mos IM

## 2016-04-20 ENCOUNTER — Other Ambulatory Visit: Payer: Self-pay | Admitting: Family Medicine

## 2016-04-20 ENCOUNTER — Telehealth: Payer: Self-pay | Admitting: Family Medicine

## 2016-04-20 MED ORDER — ESCITALOPRAM OXALATE 10 MG PO TABS: 10.0000 mg | ORAL_TABLET | Freq: Every day | ORAL | 0 refills | Status: DC

## 2016-04-20 NOTE — Telephone Encounter (Signed)
Patient notified

## 2016-04-20 NOTE — Telephone Encounter (Signed)
done

## 2016-05-21 ENCOUNTER — Other Ambulatory Visit: Payer: Self-pay | Admitting: Family Medicine

## 2016-06-12 ENCOUNTER — Other Ambulatory Visit: Payer: Self-pay | Admitting: Family Medicine

## 2016-06-12 DIAGNOSIS — F325 Major depressive disorder, single episode, in full remission: Secondary | ICD-10-CM

## 2016-07-15 ENCOUNTER — Other Ambulatory Visit
Admission: RE | Admit: 2016-07-15 | Discharge: 2016-07-15 | Disposition: A | Discharge status: Home / Self Care | Payer: Managed Care, Other (non HMO) | Source: Ambulatory Visit | Attending: Family Medicine | Admitting: Family Medicine

## 2016-07-15 ENCOUNTER — Ambulatory Visit: Payer: Managed Care, Other (non HMO) | Admitting: Family Medicine

## 2016-07-15 DIAGNOSIS — E785 Hyperlipidemia, unspecified: Secondary | ICD-10-CM | POA: Diagnosis not present

## 2016-07-15 DIAGNOSIS — E538 Deficiency of other specified B group vitamins: Secondary | ICD-10-CM | POA: Insufficient documentation

## 2016-07-15 DIAGNOSIS — Z79899 Other long term (current) drug therapy: Secondary | ICD-10-CM | POA: Insufficient documentation

## 2016-07-15 DIAGNOSIS — Z131 Encounter for screening for diabetes mellitus: Secondary | ICD-10-CM | POA: Diagnosis not present

## 2016-07-15 LAB — COMPREHENSIVE METABOLIC PANEL
ALT: 55 U/L — ABNORMAL HIGH (ref 14–54)
AST: 35 U/L (ref 15–41)
Albumin: 4.2 g/dL (ref 3.5–5.0)
Alkaline Phosphatase: 89 U/L (ref 38–126)
Anion gap: 7 (ref 5–15)
BUN: 17 mg/dL (ref 6–20)
CHLORIDE: 107 mmol/L (ref 101–111)
CO2: 27 mmol/L (ref 22–32)
Calcium: 9.3 mg/dL (ref 8.9–10.3)
Creatinine, Ser: 0.85 mg/dL (ref 0.44–1.00)
Glucose, Bld: 92 mg/dL (ref 65–99)
POTASSIUM: 4.3 mmol/L (ref 3.5–5.1)
SODIUM: 141 mmol/L (ref 135–145)
Total Bilirubin: 0.8 mg/dL (ref 0.3–1.2)
Total Protein: 7.4 g/dL (ref 6.5–8.1)

## 2016-07-15 LAB — LIPID PANEL
CHOL/HDL RATIO: 4 ratio
Cholesterol: 178 mg/dL (ref 0–200)
HDL: 45 mg/dL (ref >40)
LDL CALC: 106 mg/dL — AB (ref 0–99)
Triglycerides: 136 mg/dL (ref <150)
VLDL: 27 mg/dL (ref 0–40)

## 2016-07-15 LAB — VITAMIN B12: VITAMIN B 12: 830 pg/mL (ref 180–914)

## 2016-07-16 LAB — HEMOGLOBIN A1C
Hgb A1c MFr Bld: 5.7 % — ABNORMAL HIGH (ref 4.8–5.6)
MEAN PLASMA GLUCOSE: 117 mg/dL

## 2016-07-22 ENCOUNTER — Ambulatory Visit (INDEPENDENT_AMBULATORY_CARE_PROVIDER_SITE_OTHER): Payer: Managed Care, Other (non HMO) | Admitting: Family Medicine

## 2016-07-22 ENCOUNTER — Encounter: Payer: Self-pay | Admitting: Family Medicine

## 2016-07-22 VITALS — BP 118/80 | HR 86 | Temp 98.1°F | Resp 16 | Wt 190.6 lb

## 2016-07-22 DIAGNOSIS — K219 Gastro-esophageal reflux disease without esophagitis: Secondary | ICD-10-CM | POA: Diagnosis not present

## 2016-07-22 DIAGNOSIS — E538 Deficiency of other specified B group vitamins: Secondary | ICD-10-CM | POA: Diagnosis not present

## 2016-07-22 DIAGNOSIS — M2550 Pain in unspecified joint: Secondary | ICD-10-CM

## 2016-07-22 DIAGNOSIS — G4700 Insomnia, unspecified: Secondary | ICD-10-CM | POA: Diagnosis not present

## 2016-07-22 DIAGNOSIS — F325 Major depressive disorder, single episode, in full remission: Secondary | ICD-10-CM

## 2016-07-22 MED ORDER — ESCITALOPRAM OXALATE 10 MG PO TABS: 10.0000 mg | ORAL_TABLET | Freq: Every day | ORAL | 0 refills | Status: DC

## 2016-07-22 MED ORDER — BUPROPION HCL ER (XL) 300 MG PO TB24: 300.0000 mg | ORAL_TABLET | Freq: Every day | ORAL | 0 refills | Status: DC

## 2016-07-22 NOTE — Progress Notes (Signed)
Name: Jill Sampson   MRN: [id number]    DOB: Mar 25, 1961   Date:07/22/2016       Progress Note  Subjective  Chief Complaint  Chief Complaint  Patient presents with  . Follow-up    3 month F/U    HPI  GERD with esophagitis: she went to see Dr. Tiffany Kocher and had EGD, hiatal hernia not severe enough for surgery, but she has esophagitis and gastritis, was given carafate, still on Nexium and she was given Flovent. She states symptoms have improved. She is off Flovent, but is still taking Carafate, explained that Carafate is usually given for short term and she should contact Dr. Vira Agar. She has not talked to him yet.   Asthma mild intermittent: she is doing well at this time, no wheezing, cough or SOB  Sinusitis: she saw Dr. Farrel Conners had MRSA sinusitis, feeling well since she was treated  Arthralgia: states that for the past couple of years she wakes up feeling stiff all over, and lasts about 1 hour to feel better. Also has generalized joint aches, she will stop working and we will monitor for symptoms. She is taking Tylenol daily to control symptoms. Off Nsaid's as recommended, she does not want labs at this time  Major Depression in Remission: she has a long history of depression, she was doing well, she tried to wean self off from medication Summer 2017, but it caused relapse of depression . She resume medication and did not have to fill Seroquel. Currently no crying spells, no anhedonia. She is about the retire will start a part time job    Patient Active Problem List   Diagnosis Date Noted  . History of kidney stones 04/12/2016  . Gastritis 10/08/2015  . Hiatal hernia 05/07/2015  . Arthralgia 05/07/2015  . Insomnia, persistent 03/21/2015  . Chronic LBP 03/21/2015  . Dyslipidemia 03/21/2015  . GERD without esophagitis 03/21/2015  . H/O: hysterectomy 03/21/2015  . H/O Malignant melanoma 03/21/2015  . Depression, major, recurrent, mild ([place]) 03/21/2015  . Asthma, mild  intermittent 03/21/2015  . Plantar fasciitis 03/21/2015  . B12 deficiency 03/21/2015  . Snores 03/21/2015  . Perennial allergic rhinitis with seasonal variation 03/21/2015  . Hyperlipidemia 03/21/2015    Past Surgical History:  Procedure Laterality Date  . ABDOMINAL HYSTERECTOMY    . CHOLECYSTECTOMY    . FUNCTIONAL ENDOSCOPIC SINUS SURGERY Bilateral   . REFRACTIVE SURGERY  2007  . SHOULDER SURGERY Left     Family History  Problem Relation Age of Onset  . Alzheimer's disease Mother   . Hypertension Mother   . Stroke Mother   . Migraines Mother   . Heart attack Father   . Heart disease Father   . Cancer Sister     Breast  . Stroke Maternal Aunt   . Diabetes Maternal Uncle   . Kidney cancer Neg Hx   . Prostate cancer Neg Hx     Social History   Social History  . Marital status: Single    Spouse name: N/A  . Number of children: N/A  . Years of education: N/A   Occupational History  . Not on file.   Social History Main Topics  . Smoking status: Never Smoker  . Smokeless tobacco: Never Used  . Alcohol use No  . Drug use: Unknown  . Sexual activity: Yes   Other Topics Concern  . Not on file   Social History Narrative   Retiring Dec 22 nd 2017 from [employer]  Current Outpatient Prescriptions:  .  buPROPion (WELLBUTRIN XL) 300 MG 24 hr tablet, Take 1 tablet (300 mg total) by mouth daily., Disp: 90 tablet, Rfl: 0 .  escitalopram (LEXAPRO) 10 MG tablet, Take 1 tablet (10 mg total) by mouth daily., Disp: 90 tablet, Rfl: 0 .  esomeprazole (NEXIUM) 40 MG capsule, TAKE ONE CAPSULE BY MOUTH DAILY AT NOON, Disp: 90 capsule, Rfl: 1 .  estrogen-methylTESTOSTERone (ESTRATEST) 1.25-2.5 MG tablet, Take 1 tablet by mouth daily., Disp: , Rfl:  .  sucralfate (CARAFATE) 1 g tablet, , Disp: , Rfl:  .  mometasone-formoterol (DULERA) 100-5 MCG/ACT AERO, Inhale 2 puffs into the lungs 2 (two) times daily. (Patient not taking: Reported on 07/22/2016), Disp: 1 Inhaler, Rfl:  0  Allergies  Allergen Reactions  . Levofloxacin     rash  . Amoxicillin Rash  . Penicillins Rash     ROS  Constitutional: Negative for fever or weight change.  Respiratory: Negative for cough and shortness of breath.   Cardiovascular: Negative for chest pain or palpitations.  Gastrointestinal: Negative for abdominal pain, no bowel changes.  Musculoskeletal: Negative for gait problem or joint swelling.  Skin: Negative for rash.  Neurological: Negative for dizziness or headache.  No other specific complaints in a complete review of systems (except as listed in HPI above).  Objective  Vitals:   07/22/16 1444  BP: 118/80  Pulse: 86  Resp: 16  Temp: 98.1 F (36.7 C)  SpO2: 97%  Weight: 190 lb 9 oz (86.4 kg)    Body mass index is 28.97 kg/m.  Physical Exam  Constitutional: Patient appears well-developed and well-nourished. Obese  No distress.  HEENT: head atraumatic, normocephalic, pupils equal and reactive to light,  neck supple, throat within normal limits Cardiovascular: Normal rate, regular rhythm and normal heart sounds.  No murmur heard. No BLE edema. Pulmonary/Chest: Effort normal and breath sounds normal. No respiratory distress. Abdominal: Soft.  There is no tenderness. Psychiatric: Patient has a normal mood and affect. behavior is normal. Judgment and thought content normal.  Recent Results (from the past 2160 hour(s))  Lipid panel     Status: Abnormal   Collection Time: 07/15/16  9:12 AM  Result Value Ref Range   Cholesterol 178 0 - 200 mg/dL   Triglycerides 136 <150 mg/dL   HDL 45 >40 mg/dL   Total CHOL/HDL Ratio 4.0 RATIO   VLDL 27 0 - 40 mg/dL   LDL Cholesterol 106 (H) 0 - 99 mg/dL    Comment:        Total Cholesterol/HDL:CHD Risk Coronary Heart Disease Risk Table                     Men   Women  1/2 Average Risk   3.4   3.3  Average Risk       5.0   4.4  2 X Average Risk   9.6   7.1  3 X Average Risk  23.4   11.0        Use the calculated  Patient Ratio above and the CHD Risk Table to determine the patient's CHD Risk.        ATP III CLASSIFICATION (LDL):  <100     mg/dL   Optimal  100-129  mg/dL   Near or Above                    Optimal  130-159  mg/dL   Borderline  160-189  mg/dL  High  >190     mg/dL   Very High   Comprehensive metabolic panel     Status: Abnormal   Collection Time: 07/15/16  9:12 AM  Result Value Ref Range   Sodium 141 135 - 145 mmol/L   Potassium 4.3 3.5 - 5.1 mmol/L   Chloride 107 101 - 111 mmol/L   CO2 27 22 - 32 mmol/L   Glucose, Bld 92 65 - 99 mg/dL   BUN 17 6 - 20 mg/dL   Creatinine, Ser 0.85 0.44 - 1.00 mg/dL   Calcium 9.3 8.9 - 10.3 mg/dL   Total Protein 7.4 6.5 - 8.1 g/dL   Albumin 4.2 3.5 - 5.0 g/dL   AST 35 15 - 41 U/L   ALT 55 (H) 14 - 54 U/L   Alkaline Phosphatase 89 38 - 126 U/L   Total Bilirubin 0.8 0.3 - 1.2 mg/dL   GFR calc non Af Amer >60 >60 mL/min   GFR calc Af Amer >60 >60 mL/min    Comment: (NOTE) The eGFR has been calculated using the CKD EPI equation. This calculation has not been validated in all clinical situations. eGFR's persistently <60 mL/min signify possible Chronic Kidney Disease.    Anion gap 7 5 - 15  Vitamin B12     Status: None   Collection Time: 07/15/16  9:12 AM  Result Value Ref Range   Vitamin B-12 830 180 - 914 pg/mL    Comment: (NOTE) This assay is not validated for testing neonatal or myeloproliferative syndrome specimens for Vitamin B12 levels. Performed at [facility]   Hemoglobin A1c     Status: Abnormal   Collection Time: 07/15/16  9:12 AM  Result Value Ref Range   Hgb A1c MFr Bld 5.7 (H) 4.8 - 5.6 %    Comment: (NOTE)         Pre-diabetes: 5.7 - 6.4         Diabetes: >6.4         Glycemic control for adults with diabetes: <7.0    Mean Plasma Glucose 117 mg/dL    Comment: (NOTE) Performed At: [facility] [place], [place] [id number] Lindon Romp MD BS:[id number]     PHQ2/9: Depression  screen [facility] 2/9 07/22/2016 04/12/2016 10/08/2015 05/07/2015  Decreased Interest 0 0 0 0  Down, Depressed, Hopeless 0 0 0 0  PHQ - 2 Score 0 0 0 0     Fall Risk: Fall Risk  07/22/2016 04/12/2016 10/08/2015 05/07/2015  Falls in the past year? No No No No     Functional Status Survey: Is the patient deaf or have difficulty hearing?: No Does the patient have difficulty seeing, even when wearing glasses/contacts?: Yes (contacts ) Does the patient have difficulty concentrating, remembering, or making decisions?: No Does the patient have difficulty walking or climbing stairs?: No Does the patient have difficulty dressing or bathing?: No Does the patient have difficulty doing errands alone such as visiting a doctor's office or shopping?: No   Assessment & Plan  1. GERD without esophagitis  She needs to follow up with Dr. Vira Agar  2. Major depression in remission ([place])  - escitalopram (LEXAPRO) 10 MG tablet; Take 1 tablet (10 mg total) by mouth daily.  Dispense: 90 tablet; Refill: 0 - buPROPion (WELLBUTRIN XL) 300 MG 24 hr tablet; Take 1 tablet (300 mg total) by mouth daily.  Dispense: 90 tablet; Refill: 0  3. Insomnia, persistent  Doing well   4. B12 deficiency  Continue supplementation   5. Arthralgia, unspecified joint  Continue Tylenol

## 2016-08-06 ENCOUNTER — Other Ambulatory Visit: Payer: Self-pay | Admitting: Family Medicine

## 2016-08-06 DIAGNOSIS — G4700 Insomnia, unspecified: Secondary | ICD-10-CM

## 2016-09-08 ENCOUNTER — Other Ambulatory Visit: Payer: Self-pay | Admitting: Family Medicine

## 2016-09-08 DIAGNOSIS — F325 Major depressive disorder, single episode, in full remission: Secondary | ICD-10-CM

## 2016-09-24 ENCOUNTER — Emergency Department
Admission: EM | Admit: 2016-09-24 | Discharge: 2016-09-24 | Disposition: A | Discharge status: Home / Self Care | Payer: Commercial Managed Care - PPO | Attending: Emergency Medicine | Admitting: Emergency Medicine

## 2016-09-24 ENCOUNTER — Encounter: Payer: Self-pay | Admitting: Emergency Medicine

## 2016-09-24 DIAGNOSIS — S161XXA Strain of muscle, fascia and tendon at neck level, initial encounter: Secondary | ICD-10-CM | POA: Insufficient documentation

## 2016-09-24 DIAGNOSIS — M5412 Radiculopathy, cervical region: Secondary | ICD-10-CM | POA: Diagnosis not present

## 2016-09-24 DIAGNOSIS — J45909 Unspecified asthma, uncomplicated: Secondary | ICD-10-CM | POA: Diagnosis not present

## 2016-09-24 DIAGNOSIS — M7918 Myalgia, other site: Secondary | ICD-10-CM

## 2016-09-24 DIAGNOSIS — Y939 Activity, unspecified: Secondary | ICD-10-CM | POA: Diagnosis not present

## 2016-09-24 DIAGNOSIS — Z79899 Other long term (current) drug therapy: Secondary | ICD-10-CM | POA: Insufficient documentation

## 2016-09-24 DIAGNOSIS — S199XXA Unspecified injury of neck, initial encounter: Secondary | ICD-10-CM | POA: Diagnosis present

## 2016-09-24 DIAGNOSIS — Y929 Unspecified place or not applicable: Secondary | ICD-10-CM | POA: Insufficient documentation

## 2016-09-24 DIAGNOSIS — Y999 Unspecified external cause status: Secondary | ICD-10-CM | POA: Insufficient documentation

## 2016-09-24 MED ORDER — OXYCODONE-ACETAMINOPHEN 5-325 MG PO TABS: 1.0000 | ORAL_TABLET | Freq: Four times a day (QID) | ORAL | 0 refills | Status: DC | PRN

## 2016-09-24 MED ORDER — METHOCARBAMOL 750 MG PO TABS: 750.0000 mg | ORAL_TABLET | Freq: Four times a day (QID) | ORAL | 0 refills | Status: DC

## 2016-09-24 MED ORDER — METHYLPREDNISOLONE 4 MG PO TBPK: ORAL_TABLET | ORAL | 0 refills | Status: DC

## 2016-09-24 NOTE — ED Notes (Signed)
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
## 2016-10-07 ENCOUNTER — Other Ambulatory Visit: Payer: Self-pay | Admitting: Family Medicine

## 2016-10-07 DIAGNOSIS — J3089 Other allergic rhinitis: Principal | ICD-10-CM

## 2016-10-07 DIAGNOSIS — F325 Major depressive disorder, single episode, in full remission: Secondary | ICD-10-CM

## 2016-10-07 DIAGNOSIS — J302 Other seasonal allergic rhinitis: Secondary | ICD-10-CM

## 2016-10-07 NOTE — Telephone Encounter (Signed)
Patient requesting refill of Lexapro and Flonase to [facility].

## 2016-10-18 ENCOUNTER — Other Ambulatory Visit: Payer: Self-pay | Admitting: Family Medicine

## 2016-10-18 DIAGNOSIS — F325 Major depressive disorder, single episode, in full remission: Secondary | ICD-10-CM

## 2016-10-18 NOTE — Telephone Encounter (Signed)
Patient requesting refill of Bupropion to [facility].  

## 2016-11-18 ENCOUNTER — Ambulatory Visit (INDEPENDENT_AMBULATORY_CARE_PROVIDER_SITE_OTHER): Payer: Commercial Managed Care - PPO | Admitting: Family Medicine

## 2016-11-18 ENCOUNTER — Encounter: Payer: Self-pay | Admitting: Family Medicine

## 2016-11-18 VITALS — BP 127/77 | HR 73 | Temp 98.1°F | Resp 16 | Ht 68.0 in | Wt 191.1 lb

## 2016-11-18 DIAGNOSIS — F325 Major depressive disorder, single episode, in full remission: Secondary | ICD-10-CM

## 2016-11-18 DIAGNOSIS — J3089 Other allergic rhinitis: Secondary | ICD-10-CM | POA: Diagnosis not present

## 2016-11-18 DIAGNOSIS — G4700 Insomnia, unspecified: Secondary | ICD-10-CM | POA: Diagnosis not present

## 2016-11-18 DIAGNOSIS — J4521 Mild intermittent asthma with (acute) exacerbation: Secondary | ICD-10-CM | POA: Diagnosis not present

## 2016-11-18 DIAGNOSIS — K219 Gastro-esophageal reflux disease without esophagitis: Secondary | ICD-10-CM | POA: Diagnosis not present

## 2016-11-18 DIAGNOSIS — J0101 Acute recurrent maxillary sinusitis: Secondary | ICD-10-CM | POA: Diagnosis not present

## 2016-11-18 DIAGNOSIS — J302 Other seasonal allergic rhinitis: Secondary | ICD-10-CM

## 2016-11-18 DIAGNOSIS — E785 Hyperlipidemia, unspecified: Secondary | ICD-10-CM | POA: Diagnosis not present

## 2016-11-18 MED ORDER — ESCITALOPRAM OXALATE 10 MG PO TABS: 10.0000 mg | ORAL_TABLET | Freq: Every day | ORAL | 0 refills | Status: DC

## 2016-11-18 MED ORDER — BUPROPION HCL ER (XL) 300 MG PO TB24: 300.0000 mg | ORAL_TABLET | Freq: Every day | ORAL | 0 refills | Status: DC

## 2016-11-18 MED ORDER — LEVOCETIRIZINE DIHYDROCHLORIDE 5 MG PO TABS: 5.0000 mg | ORAL_TABLET | Freq: Every evening | ORAL | 1 refills | Status: DC

## 2016-11-18 MED ORDER — AZITHROMYCIN 500 MG PO TABS: ORAL_TABLET | ORAL | 0 refills | Status: DC

## 2016-11-18 MED ORDER — MONTELUKAST SODIUM 10 MG PO TABS: 10.0000 mg | ORAL_TABLET | Freq: Every day | ORAL | 1 refills | Status: DC

## 2016-11-18 MED ORDER — MOMETASONE FURO-FORMOTEROL FUM 100-5 MCG/ACT IN AERO: 2.0000 | INHALATION_SPRAY | Freq: Two times a day (BID) | RESPIRATORY_TRACT | 2 refills | Status: DC

## 2016-11-18 MED ORDER — ESOMEPRAZOLE MAGNESIUM 40 MG PO CPDR: DELAYED_RELEASE_CAPSULE | ORAL | 1 refills | Status: DC
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
## 2016-12-21 ENCOUNTER — Ambulatory Visit: Payer: Managed Care, Other (non HMO) | Admitting: Family Medicine

## 2017-03-06 ENCOUNTER — Other Ambulatory Visit: Payer: Self-pay | Admitting: Family Medicine

## 2017-03-06 DIAGNOSIS — F325 Major depressive disorder, single episode, in full remission: Secondary | ICD-10-CM

## 2017-03-06 NOTE — Telephone Encounter (Signed)
Patient requesting refill of Wellbutrin to [facility]. 

## 2017-03-07 NOTE — Telephone Encounter (Signed)
LMOM to schedule an appt.

## 2017-03-14 ENCOUNTER — Telehealth: Payer: Self-pay | Admitting: Family Medicine

## 2017-03-14 DIAGNOSIS — F325 Major depressive disorder, single episode, in full remission: Secondary | ICD-10-CM

## 2017-03-15 NOTE — Telephone Encounter (Signed)
Left voice mail informing patient that prescription has been sent to pharmacy and for her to call the office to schedule an appointment.

## 2017-04-07 ENCOUNTER — Telehealth: Payer: Self-pay

## 2017-04-07 NOTE — Telephone Encounter (Signed)
Insurance does not cover Dulera please change to preferred drug: Advair, Breo Ellipta or Symbicort.

## 2017-04-07 NOTE — Telephone Encounter (Signed)
Patient needs follow up, last visit was 2017

## 2017-04-10 ENCOUNTER — Other Ambulatory Visit: Payer: Self-pay | Admitting: Family Medicine

## 2017-04-10 DIAGNOSIS — F325 Major depressive disorder, single episode, in full remission: Secondary | ICD-10-CM

## 2017-04-13 ENCOUNTER — Encounter: Payer: Self-pay | Admitting: Family Medicine

## 2017-04-13 ENCOUNTER — Ambulatory Visit (INDEPENDENT_AMBULATORY_CARE_PROVIDER_SITE_OTHER): Payer: Commercial Managed Care - PPO | Admitting: Family Medicine

## 2017-04-13 VITALS — BP 124/78 | HR 64 | Temp 98.3°F | Resp 14 | Wt 189.9 lb

## 2017-04-13 DIAGNOSIS — E785 Hyperlipidemia, unspecified: Secondary | ICD-10-CM | POA: Diagnosis not present

## 2017-04-13 DIAGNOSIS — K449 Diaphragmatic hernia without obstruction or gangrene: Secondary | ICD-10-CM | POA: Diagnosis not present

## 2017-04-13 DIAGNOSIS — J302 Other seasonal allergic rhinitis: Secondary | ICD-10-CM

## 2017-04-13 DIAGNOSIS — J3089 Other allergic rhinitis: Secondary | ICD-10-CM | POA: Diagnosis not present

## 2017-04-13 DIAGNOSIS — J4521 Mild intermittent asthma with (acute) exacerbation: Secondary | ICD-10-CM | POA: Diagnosis not present

## 2017-04-13 DIAGNOSIS — G4700 Insomnia, unspecified: Secondary | ICD-10-CM | POA: Diagnosis not present

## 2017-04-13 DIAGNOSIS — Z23 Encounter for immunization: Secondary | ICD-10-CM

## 2017-04-13 DIAGNOSIS — F411 Generalized anxiety disorder: Secondary | ICD-10-CM | POA: Diagnosis not present

## 2017-04-13 DIAGNOSIS — E538 Deficiency of other specified B group vitamins: Secondary | ICD-10-CM

## 2017-04-13 DIAGNOSIS — K219 Gastro-esophageal reflux disease without esophagitis: Secondary | ICD-10-CM | POA: Diagnosis not present

## 2017-04-13 DIAGNOSIS — J4540 Moderate persistent asthma, uncomplicated: Secondary | ICD-10-CM

## 2017-04-13 DIAGNOSIS — F325 Major depressive disorder, single episode, in full remission: Secondary | ICD-10-CM | POA: Diagnosis not present

## 2017-04-13 MED ORDER — CYANOCOBALAMIN 1000 MCG/ML IJ SOLN
1000.0000 ug | Freq: Once | INTRAMUSCULAR | Status: AC
  Administered 2017-04-13: 1000 ug via INTRAMUSCULAR

## 2017-04-13 MED ORDER — FLUTICASONE PROPIONATE 50 MCG/ACT NA SUSP: 2.0000 | NASAL | 2 refills | Status: DC | PRN

## 2017-04-13 MED ORDER — FLUTICASONE FUROATE-VILANTEROL 100-25 MCG/INH IN AEPB: 1.0000 | INHALATION_SPRAY | Freq: Every day | RESPIRATORY_TRACT | 2 refills | Status: DC

## 2017-04-13 MED ORDER — MONTELUKAST SODIUM 10 MG PO TABS: 10.0000 mg | ORAL_TABLET | Freq: Every day | ORAL | 1 refills | Status: DC

## 2017-04-13 MED ORDER — ALBUTEROL SULFATE HFA 108 (90 BASE) MCG/ACT IN AERS: 2.0000 | INHALATION_SPRAY | Freq: Four times a day (QID) | RESPIRATORY_TRACT | 0 refills | Status: DC | PRN

## 2017-04-13 MED ORDER — ESOMEPRAZOLE MAGNESIUM 40 MG PO CPDR: DELAYED_RELEASE_CAPSULE | ORAL | 1 refills | Status: DC

## 2017-04-13 MED ORDER — ESCITALOPRAM OXALATE 20 MG PO TABS: 20.0000 mg | ORAL_TABLET | Freq: Every day | ORAL | 0 refills | Status: DC

## 2017-04-13 MED ORDER — LEVOCETIRIZINE DIHYDROCHLORIDE 5 MG PO TABS: 5.0000 mg | ORAL_TABLET | Freq: Every evening | ORAL | 1 refills | Status: DC

## 2017-04-13 MED ORDER — FLUTICASONE FUROATE-VILANTEROL 100-25 MCG/INH IN AEPB: 1.0000 | INHALATION_SPRAY | Freq: Every day | RESPIRATORY_TRACT | Status: DC

## 2017-04-13 NOTE — Progress Notes (Signed)
Name: Jill Sampson   MRN: [id number]    DOB: 02/04/1961   Date:04/13/2017       Progress Note  Subjective  Chief Complaint  Chief Complaint  Patient presents with  . Medication Refill    HPI  GERD with esophagitis: she went to see Dr. Markham JordanElliot and had EGD, hiatal hernia not severe enough for surgery, but she has a history of esophagitis and gastritis, was given carafate, still on Nexium and she also took Flovent for period of time.  She states she has been off Carafate and has noticed worsening of symptoms recently. Feeling bloated on a regular basis. Advised to discuss it with Dr. Mechele CollinElliott .   Asthma Moderate poorly controlled:  she has a daily dry cough , going on for months, she also has weekly episodes of wheezing, SOB with moderate activity. She has been taking Singulair daily, but [facility].   Major Depression Mild recurrent: she has a long history of depression, she was doing well, she tried to wean self off from medication Summer 2017, but it caused relapse of depression, she was doing well, but over the past few months she has been going to the beach to stay with her sister and gets annoyed easily. She states things that should not bother her get on her nerves, but no sadness or crying spells. Denies suicidal thoughts or ideation. She would like to try adjusting her medication   Patient Active Problem List   Diagnosis Date Noted  . History of kidney stones 04/12/2016  . Gastritis 10/08/2015  . Hiatal hernia 05/07/2015  . Arthralgia 05/07/2015  . Insomnia, persistent 03/21/2015  . Chronic LBP 03/21/2015  . Dyslipidemia 03/21/2015  . GERD without esophagitis 03/21/2015  . H/O: hysterectomy 03/21/2015  . H/O Malignant melanoma 03/21/2015  . Depression, major, recurrent, mild (HCC) 03/21/2015  . Asthma, moderate persistent, poorly-controlled 03/21/2015  . Plantar fasciitis 03/21/2015  . B12 deficiency 03/21/2015  . Snores 03/21/2015  . Perennial  allergic rhinitis with seasonal variation 03/21/2015  . Hyperlipidemia 03/21/2015    Past Surgical History:  Procedure Laterality Date  . ABDOMINAL HYSTERECTOMY    . CHOLECYSTECTOMY    . FUNCTIONAL ENDOSCOPIC SINUS SURGERY Bilateral   . REFRACTIVE SURGERY  2007  . SHOULDER SURGERY Left     Family History  Problem Relation Age of Onset  . Alzheimer's disease Mother   . Hypertension Mother   . Stroke Mother   . Migraines Mother   . Heart attack Father   . Heart disease Father   . Cancer Sister        Breast  . Stroke Maternal Aunt   . Diabetes Maternal Uncle   . Kidney cancer Neg Hx   . Prostate cancer Neg Hx     Social History   Social History  . Marital status: Single    Spouse name: N/A  . Number of children: N/A  . Years of education: N/A   Occupational History  . Not on file.   Social History Main Topics  . Smoking status: Never Smoker  . Smokeless tobacco: Never Used  . Alcohol use No  . Drug use: No  . Sexual activity: Yes   Other Topics Concern  . Not on file   Social History Narrative   Retired Dec 22 nd 2017 from [employer]        Current Outpatient Prescriptions:  .  buPROPion (WELLBUTRIN XL) 300 MG 24 hr tablet, TAKE 1 TABLET  BY MOUTH EVERY DAY, Disp: 30 tablet, Rfl: 0 .  escitalopram (LEXAPRO) 20 MG tablet, Take 1 tablet (20 mg total) by mouth daily., Disp: 90 tablet, Rfl: 0 .  esomeprazole (NEXIUM) 40 MG capsule, TAKE ONE CAPSULE BY MOUTH DAILY AT NOON, Disp: 90 capsule, Rfl: 1 .  estrogen-methylTESTOSTERone (ESTRATEST) 1.25-2.5 MG tablet, Take 1 tablet by mouth daily., Disp: , Rfl:  .  fluticasone (FLONASE) 50 MCG/ACT nasal spray, Place 2 sprays into both nostrils as needed., Disp: 16 g, Rfl: 2 .  montelukast (SINGULAIR) 10 MG tablet, Take 1 tablet (10 mg total) by mouth at bedtime., Disp: 90 tablet, Rfl: 1 .  albuterol (PROVENTIL HFA;VENTOLIN HFA) 108 (90 Base) MCG/ACT inhaler, Inhale 2 puffs into the lungs every 6 (six) hours as needed for  wheezing or shortness of breath., Disp: 1 Inhaler, Rfl: 0 .  levocetirizine (XYZAL) 5 MG tablet, Take 1 tablet (5 mg total) by mouth every evening., Disp: 90 tablet, Rfl: 1  Allergies  Allergen Reactions  . Levofloxacin     rash  . Amoxicillin Rash  . Doxycycline Rash  . Penicillins Rash     ROS  Constitutional: Negative for fever or weight change.  Respiratory: Positive for daily  cough and shortness of breath with activity .   Cardiovascular: Negative for chest pain or palpitations.  Gastrointestinal: Negative for abdominal pain, no bowel changes.  Musculoskeletal: Negative for gait problem or joint swelling.  Skin: Negative for rash.  Neurological: Negative for dizziness or headache.  No other specific complaints in a complete review of systems (except as listed in HPI above).  Objective  Vitals:   04/13/17 1023  BP: 124/78  Pulse: 64  Resp: 14  Temp: 98.3 F (36.8 C)  TempSrc: Oral  SpO2: 98%  Weight: 189 lb 14.4 oz (86.1 kg)    Body mass index is 28.87 kg/m.  Physical Exam  Constitutional: Patient appears well-developed and well-nourished. Obese No distress.  HEENT: head atraumatic, normocephalic, pupils equal and reactive to light,  neck supple, throat within normal limits Cardiovascular: Normal rate, regular rhythm and normal heart sounds.  No murmur heard. No BLE edema. Pulmonary/Chest: Effort normal and breath sounds normal. No respiratory distress. Abdominal: Soft.  There is no tenderness. Psychiatric: Patient has a normal mood and affect. behavior is normal. Judgment and thought content normal.   PHQ2/9: Depression screen [facility] 2/9 04/13/2017 11/18/2016 07/22/2016 04/12/2016 10/08/2015  Decreased Interest 0 0 0 0 0  Down, Depressed, Hopeless 0 0 0 0 0  PHQ - 2 Score 0 0 0 0 0   GAD 7 : Generalized Anxiety Score 04/13/2017  Nervous, Anxious, on Edge 3  Control/stop worrying 2  Worry too much - different things 2  Trouble relaxing 3  Restless 3  Easily  annoyed or irritable 3  Afraid - awful might happen 0  Total GAD 7 Score 16  Anxiety Difficulty Somewhat difficult     Fall Risk: Fall Risk  04/13/2017 11/18/2016 07/22/2016 04/12/2016 10/08/2015  Falls in the past year? No No No No No    Functional Status Survey: Is the patient deaf or have difficulty hearing?: No Does the patient have difficulty seeing, even when wearing glasses/contacts?: Yes Does the patient have difficulty concentrating, remembering, or making decisions?: No Does the patient have difficulty walking or climbing stairs?: No Does the patient have difficulty dressing or bathing?: No Does the patient have difficulty doing errands alone such as visiting a doctor's office or shopping?: No  Assessment &  Plan  1. Asthma, moderate persistent, poorly-controlled  - Spirometry: Pre  Was normal, but patient is having daily symptoms we will add Breo - albuterol (PROVENTIL HFA;VENTOLIN HFA) 108 (90 Base) MCG/ACT inhaler; Inhale 2 puffs into the lungs every 6 (six) hours as needed for wheezing or shortness of breath.  Dispense: 1 Inhaler; Refill: 0 - Breo 100-25  one puff daily   2. Major depression in remission (HCC)  - escitalopram (LEXAPRO) 20 MG tablet; Take 1 tablet (20 mg total) by mouth daily.  Dispense: 90 tablet; Refill: 0  3. GERD without esophagitis  - esomeprazole (NEXIUM) 40 MG capsule; TAKE ONE CAPSULE BY MOUTH DAILY AT NOON  Dispense: 90 capsule; Refill: 1  4. Perennial allergic rhinitis with seasonal variation  - montelukast (SINGULAIR) 10 MG tablet; Take 1 tablet (10 mg total) by mouth at bedtime.  Dispense: 90 tablet; Refill: 1 - fluticasone (FLONASE) 50 MCG/ACT nasal spray; Place 2 sprays into both nostrils as needed.  Dispense: 16 g; Refill: 2  5. Dyslipidemia  On diet only   6. Insomnia, persistent  No problems at this time  7. Hiatal hernia  Continue Nexium   8. B12 deficiency  - B12 injection today  9. Allergic asthma, mild intermittent,  with acute exacerbation  - montelukast (SINGULAIR) 10 MG tablet; Take 1 tablet (10 mg total) by mouth at bedtime.  Dispense: 90 tablet; Refill: 1 - levocetirizine (XYZAL) 5 MG tablet; Take 1 tablet (5 mg total) by mouth every evening.  Dispense: 90 tablet; Refill: 1  10. Needs flu shot  - Flu Vaccine QUAD 36+ mos IM  11. Need for pneumococcal vaccination  - Pneumococcal conjugate vaccine 13-valent IM  12. GAD (generalized anxiety disorder)  We will go up on Lexapro and if symptoms still present in a few weeks we will go down on Wellbutrin dose to 150 mg

## 2017-04-13 NOTE — Patient Instructions (Signed)
.  [email or web address]

## 2017-04-19 ENCOUNTER — Encounter: Payer: Self-pay | Admitting: Podiatry

## 2017-04-19 ENCOUNTER — Ambulatory Visit (INDEPENDENT_AMBULATORY_CARE_PROVIDER_SITE_OTHER): Payer: Commercial Managed Care - PPO

## 2017-04-19 ENCOUNTER — Ambulatory Visit (INDEPENDENT_AMBULATORY_CARE_PROVIDER_SITE_OTHER): Payer: Commercial Managed Care - PPO | Admitting: Podiatry

## 2017-04-19 VITALS — BP 120/72 | HR 65 | Resp 16

## 2017-04-19 DIAGNOSIS — M722 Plantar fascial fibromatosis: Secondary | ICD-10-CM

## 2017-04-19 MED ORDER — METHYLPREDNISOLONE 4 MG PO TBPK: ORAL_TABLET | ORAL | 0 refills | Status: DC

## 2017-04-19 MED ORDER — MELOXICAM 15 MG PO TABS: 15.0000 mg | ORAL_TABLET | Freq: Every day | ORAL | 3 refills | Status: DC

## 2017-04-19 NOTE — Patient Instructions (Signed)

## 2017-04-19 NOTE — Progress Notes (Signed)
Subjective:  Patient ID: Jill Sampson, female    DOB: 05/20/1961,  MRN: [id number] HPI Chief Complaint  Patient presents with  . Foot Pain    Plantar heel left - throbbing for 3 months, AM pain, some days constant pain, thinks pain started after stepping on seashell, tried tylenol    56 y.o. female presents with the above complaint.  Past Medical History:  Diagnosis Date  . Allergy   . Anxiety   . Asthma   . Chronic insomnia   . Depression   . Dyslipidemia   . Esophagitis   . Gastritis   . GERD (gastroesophageal reflux disease)   . History of melanoma   . Low back pain   . Panic attack   . Snoring   . Vitamin B 12 deficiency    Past Surgical History:  Procedure Laterality Date  . ABDOMINAL HYSTERECTOMY    . CHOLECYSTECTOMY    . FUNCTIONAL ENDOSCOPIC SINUS SURGERY Bilateral   . REFRACTIVE SURGERY  2007  . SHOULDER SURGERY Left     Current Outpatient Prescriptions:  .  albuterol (PROVENTIL HFA;VENTOLIN HFA) 108 (90 Base) MCG/ACT inhaler, Inhale 2 puffs into the lungs every 6 (six) hours as needed for wheezing or shortness of breath., Disp: 1 Inhaler, Rfl: 0 .  buPROPion (WELLBUTRIN XL) 300 MG 24 hr tablet, TAKE 1 TABLET BY MOUTH EVERY DAY, Disp: 30 tablet, Rfl: 0 .  escitalopram (LEXAPRO) 20 MG tablet, Take 1 tablet (20 mg total) by mouth daily., Disp: 90 tablet, Rfl: 0 .  esomeprazole (NEXIUM) 40 MG capsule, TAKE ONE CAPSULE BY MOUTH DAILY AT NOON, Disp: 90 capsule, Rfl: 1 .  estrogen-methylTESTOSTERone (ESTRATEST) 1.25-2.5 MG tablet, Take 1 tablet by mouth daily., Disp: , Rfl:  .  fluticasone (FLONASE) 50 MCG/ACT nasal spray, Place 2 sprays into both nostrils as needed., Disp: 16 g, Rfl: 2 .  fluticasone furoate-vilanterol (BREO ELLIPTA) 100-25 MCG/INH AEPB, Inhale 1 puff into the lungs daily., Disp: 1 each, Rfl: 2 .  levocetirizine (XYZAL) 5 MG tablet, Take 1 tablet (5 mg total) by mouth every evening., Disp: 90 tablet, Rfl: 1 .  montelukast (SINGULAIR) 10 MG  tablet, Take 1 tablet (10 mg total) by mouth at bedtime., Disp: 90 tablet, Rfl: 1  Allergies  Allergen Reactions  . Levofloxacin     rash  . Amoxicillin Rash  . Doxycycline Rash  . Penicillins Rash   Review of Systems Objective:   Vitals:   04/19/17 1118  BP: 120/72  Pulse: 65  Resp: 16   General AA&O x3. Patient is alert and oriented.  Vascular Dorsalis pedis and posterior tibial pulses 2/4 bilat. Brisk capillary refill to all digits. Pedal hair present.  Neurologic Epicritic sensation grossly intact.  Dermatologic No open lesions. Interspaces clear of maceration. Nails well groomed and normal in appearance.  Orthopedic: MMT 5/5 in dorsiflexion, plantarflexion, inversion, and eversion. Normal joint ROM without pain or crepitus.She has pain on palpation medial calcaneal tubercles of the left heel. It is warm to the touch exquisitely tender on palpation.    Radiographs:  Radiographs of the left foot taken today demonstrates normal osseous architecture posterior and plantar calcaneal heel spurs. No fractures are identified. Osseous immature individual. Radiographs do indicate the soft tissue increase in density at the plantar fascia cannula insertion site.  Assessment & Plan:   Assessment: Plantar fasciitis left foot.  Plan: We discussed the etiology pathology conservative versus surgical therapies. At this point I injected her left heel with Kenalog  and local anesthetic. Started her on a Medrol Dosepak to be followed by meloxicam. Placed her in a plantar fascial brace and a night splint. Discussed proprius retching exercises ice therapy issue gear modifications.  Patient was evaluated and treated and all questions were answered.  She does not want to wait very long before surgery she's had this before would like to have this surgically corrected.   Zaydn Gutridge T. [place], [place]

## 2017-05-24 ENCOUNTER — Ambulatory Visit: Payer: Commercial Managed Care - PPO | Admitting: Podiatry

## 2017-06-07 ENCOUNTER — Ambulatory Visit (INDEPENDENT_AMBULATORY_CARE_PROVIDER_SITE_OTHER): Payer: Commercial Managed Care - PPO | Admitting: Podiatry

## 2017-06-07 DIAGNOSIS — M722 Plantar fascial fibromatosis: Secondary | ICD-10-CM | POA: Diagnosis not present

## 2017-06-07 NOTE — Patient Instructions (Signed)
Pre-Operative Instructions  Congratulations, you have decided to take an important step towards improving your quality of life.  You can be assured that the doctors and staff at [facility] will be with you every step of the way.  Here are some important things you should know:  1. Plan to be at the surgery center/hospital at least 1 (one) hour prior to your scheduled time, unless otherwise directed by the surgical center/hospital staff.  You must have a responsible adult accompany you, remain during the surgery and drive you home.  Make sure you have directions to the surgical center/hospital to ensure you arrive on time. 2. If you are having surgery at [facility] or [facility] hospitals, you will need a copy of your medical history and physical form from your family physician within one month prior to the date of surgery. We will give you a form for your primary physician to complete.  3. We make every effort to accommodate the date you request for surgery.  However, there are times where surgery dates or times have to be moved.  We will contact you as soon as possible if a change in schedule is required.   4. No aspirin/ibuprofen for one week before surgery.  If you are on aspirin, any non-steroidal anti-inflammatory medications (Mobic, Aleve, Ibuprofen) should not be taken seven (7) days prior to your surgery.  You make take Tylenol for pain prior to surgery.  5. Medications - If you are taking daily heart and blood pressure medications, seizure, reflux, allergy, asthma, anxiety, pain or diabetes medications, make sure you notify the surgery center/hospital before the day of surgery so they can tell you which medications you should take or avoid the day of surgery. 6. No food or drink after midnight the night before surgery unless directed otherwise by surgical center/hospital staff. 7. No alcoholic beverages 24-hours prior to surgery.  No smoking 24-hours prior or 24-hours after  surgery. 8. Wear loose pants or shorts. They should be loose enough to fit over bandages, boots, and casts. 9. Don't wear slip-on shoes. Sneakers are preferred. 10. Bring your boot with you to the surgery center/hospital.  Also bring crutches or a walker if your physician has prescribed it for you.  If you do not have this equipment, it will be provided for you after surgery. 11. If you have not been contacted by the surgery center/hospital by the day before your surgery, call to confirm the date and time of your surgery. 12. Leave-time from work may vary depending on the type of surgery you have.  Appropriate arrangements should be made prior to surgery with your employer. 13. Prescriptions will be provided immediately following surgery by your doctor.  Fill these as soon as possible after surgery and take the medication as directed. Pain medications will not be refilled on weekends and must be approved by the doctor. 14. Remove nail polish on the operative foot and avoid getting pedicures prior to surgery. 15. Wash the night before surgery.  The night before surgery wash the foot and leg well with water and the antibacterial soap provided. Be sure to pay special attention to beneath the toenails and in between the toes.  Wash for at least three (3) minutes. Rinse thoroughly with water and dry well with a towel.  Perform this wash unless told not to do so by your physician.  Enclosed: 1 Ice pack (please put in freezer the night before surgery)   1 Hibiclens skin cleaner     Pre-op instructions  If you have any questions regarding the instructions, please do not hesitate to call our office.  [place]: [address], [place], [place] [address] -- [phone]  [place]: [address] [place], [place] [address] -- [phone]  [place]: [address]  [place], [place] [address] -- [phone]  [place]: [address], [address], [place],  [address] -- [phone]  Website:  [email or web address] 

## 2017-06-08 ENCOUNTER — Telehealth: Payer: Self-pay | Admitting: *Deleted

## 2017-06-08 NOTE — Telephone Encounter (Signed)
"  I'm a patient of Dr. Al CorpusHyatt.  I'd like to schedule my surgery."  Do you have a date in mind that you would like to schedule your surgery?  "Yes, I'd like to do it next week, November 8."  He doesn't have anything that day.  "Can he do it on November 16?"  Yes, he can.  I will get it scheduled.  You will receive a call from someone from the surgical center with the arrival time a day or two prior to surgery date.  You can register online now with the surgical center, instructions are in the brochure that was given to you.

## 2017-06-10 NOTE — Progress Notes (Signed)
She presents today for follow-up of her plantar fasciitis to her left foot. States that I'm ready for him to cut it. She reports that the injection helped for about 3 weeks then the pain came right back and is now more severe than it was previously. She was to discuss surgical intervention.  Objective: Vital signs are stable alert and oriented 3. Pulses are strongly palpable. She has pain on palpation medial calcaneal tubercle of the left heel.  Assessment: Plantar fasciitis left heel.  Plan: Discussed endoscopic plantar fasciotomy of her left heel. When over the consent form today thoroughly line by line number by number. She was given both oral and ongoing instructions preop and postop was dispensed a cam walker. I will follow up with her in the near future for surgery.

## 2017-06-20 ENCOUNTER — Other Ambulatory Visit: Payer: Self-pay | Admitting: Podiatry

## 2017-06-20 MED ORDER — HYDROMORPHONE HCL 4 MG PO TABS: 4.0000 mg | ORAL_TABLET | ORAL | 0 refills | Status: DC | PRN

## 2017-06-20 MED ORDER — CLINDAMYCIN HCL 150 MG PO CAPS: 150.0000 mg | ORAL_CAPSULE | Freq: Three times a day (TID) | ORAL | 0 refills | Status: DC

## 2017-06-20 MED ORDER — PROMETHAZINE HCL 25 MG PO TABS: 25.0000 mg | ORAL_TABLET | Freq: Three times a day (TID) | ORAL | 0 refills | Status: DC | PRN

## 2017-06-23 DIAGNOSIS — M722 Plantar fascial fibromatosis: Secondary | ICD-10-CM | POA: Diagnosis not present

## 2017-06-23 HISTORY — PX: PLANTAR FASCIA RELEASE: SHX2239

## 2017-06-27 ENCOUNTER — Encounter: Payer: Commercial Managed Care - PPO | Admitting: Podiatry

## 2017-06-27 ENCOUNTER — Ambulatory Visit (INDEPENDENT_AMBULATORY_CARE_PROVIDER_SITE_OTHER): Payer: Commercial Managed Care - PPO | Admitting: Podiatry

## 2017-06-27 ENCOUNTER — Encounter: Payer: Self-pay | Admitting: Podiatry

## 2017-06-27 VITALS — BP 102/72 | HR 83 | Temp 98.9°F | Resp 16

## 2017-06-27 DIAGNOSIS — Z9889 Other specified postprocedural states: Secondary | ICD-10-CM

## 2017-07-02 NOTE — Progress Notes (Signed)
   Subjective:  Patient presents today status post EPF left by Dr. Al CorpusHyatt. DOS: 06/23/17. She states her pain is tolerable without taking pain medication. She states she is doing well overall. She denies any fever or chills.     Past Medical History:  Diagnosis Date  . Allergy   . Anxiety   . Asthma   . Chronic insomnia   . Depression   . Dyslipidemia   . Esophagitis   . Gastritis   . GERD (gastroesophageal reflux disease)   . History of melanoma   . Low back pain   . Panic attack   . Snoring   . Vitamin B 12 deficiency       Objective/Physical Exam Skin incisions appear to be well coapted with sutures and staples intact. No sign of infectious process noted. No dehiscence. No active bleeding noted. Moderate edema noted to the surgical extremity.  Assessment: 1. s/p EPF left. DOS: 06/23/17   Plan of Care:  1. Patient was evaluated. 2. Dressing changed. Keep CDI. 3. Continue weightbearing in CAM boot. 4. Return to clinic at next scheduled appointment with Dr. Al CorpusHyatt.    Felecia ShellingBrent M. Evans, DPM [facility]  Dr. Felecia ShellingBrent M. Evans, DPM    [address]                                        [place], [place] [address]                Office [phone]  Fax [phone]

## 2017-07-04 ENCOUNTER — Telehealth: Payer: Self-pay | Admitting: *Deleted

## 2017-07-04 NOTE — Telephone Encounter (Signed)
DOS: 06/23/17  Endoscopic Plantar Fasciotomy,left foot.

## 2017-07-05 ENCOUNTER — Ambulatory Visit (INDEPENDENT_AMBULATORY_CARE_PROVIDER_SITE_OTHER): Payer: Commercial Managed Care - PPO | Admitting: Podiatry

## 2017-07-05 ENCOUNTER — Encounter: Payer: Self-pay | Admitting: Podiatry

## 2017-07-05 DIAGNOSIS — M722 Plantar fascial fibromatosis: Secondary | ICD-10-CM

## 2017-07-05 NOTE — Progress Notes (Signed)
She presents today for follow-up of endoscopic plantar fasciotomy 2 weeks ago. She states that she is doing very well at the sides of the foot is somewhat tender because the boot tends to rub them.  Objective: Sutures are intact arms well coapted removed the sutures today and margins remain well coapted. She has minimal tenderness on palpation medial tubercle.  Assessment: Resolving endoscopic plantar fasciotomy pain.  Plan: I recommend that she continue to wear tennis shoes during the daytime but a night splint or Cam Walker at nighttime for the next month. I will follow-up with her in 2-4 weeks.

## 2017-07-14 ENCOUNTER — Encounter: Payer: Self-pay | Admitting: Family Medicine

## 2017-07-14 ENCOUNTER — Ambulatory Visit: Payer: Commercial Managed Care - PPO | Admitting: Family Medicine

## 2017-07-14 VITALS — Ht 68.0 in

## 2017-07-14 DIAGNOSIS — J4541 Moderate persistent asthma with (acute) exacerbation: Secondary | ICD-10-CM

## 2017-07-14 DIAGNOSIS — E785 Hyperlipidemia, unspecified: Secondary | ICD-10-CM | POA: Diagnosis not present

## 2017-07-14 DIAGNOSIS — E538 Deficiency of other specified B group vitamins: Secondary | ICD-10-CM | POA: Diagnosis not present

## 2017-07-14 DIAGNOSIS — G4700 Insomnia, unspecified: Secondary | ICD-10-CM

## 2017-07-14 DIAGNOSIS — Z131 Encounter for screening for diabetes mellitus: Secondary | ICD-10-CM | POA: Diagnosis not present

## 2017-07-14 DIAGNOSIS — F325 Major depressive disorder, single episode, in full remission: Secondary | ICD-10-CM | POA: Diagnosis not present

## 2017-07-14 DIAGNOSIS — K219 Gastro-esophageal reflux disease without esophagitis: Secondary | ICD-10-CM

## 2017-07-14 DIAGNOSIS — F411 Generalized anxiety disorder: Secondary | ICD-10-CM | POA: Diagnosis not present

## 2017-07-14 DIAGNOSIS — Z79899 Other long term (current) drug therapy: Secondary | ICD-10-CM | POA: Diagnosis not present

## 2017-07-14 MED ORDER — HYDROCOD POLST-CPM POLST ER 10-8 MG/5ML PO SUER: 5.0000 mL | Freq: Every evening | ORAL | 0 refills | Status: DC | PRN

## 2017-07-14 MED ORDER — BUPROPION HCL ER (XL) 150 MG PO TB24: 150.0000 mg | ORAL_TABLET | Freq: Every day | ORAL | 1 refills | Status: DC

## 2017-07-14 MED ORDER — ESCITALOPRAM OXALATE 10 MG PO TABS: 10.0000 mg | ORAL_TABLET | Freq: Every day | ORAL | 1 refills | Status: DC

## 2017-07-14 MED ORDER — PREDNISONE 20 MG PO TABS: 20.0000 mg | ORAL_TABLET | Freq: Every day | ORAL | 0 refills | Status: DC

## 2017-07-14 NOTE — Progress Notes (Signed)
Name: Jill Sampson   MRN: [id number]    DOB: 06/07/1961   Date:07/14/2017       Progress Note  Subjective  Chief Complaint  Chief Complaint  Patient presents with  . Asthma  . Depression  . Cough  . Sore Throat    HPI  GERD with esophagitis: she went to see Dr. Markham JordanElliot and had EGD, hiatal hernia not severe enough for surgery, but she has a history of esophagitis and gastritis, was given carafate - completed course , still on Nexium and she also took Flovent for period of time. She is doing well at this time. She denies heartburn or indigestion.    Asthma Moderate poorly controlled with exacerbation  she has a daily dry cough , also has to clear her throat, but worse over the past few days, noticed more post-nasal drainage, cough is still dry, sometimes wet sounding, no fever or chills, but has noticed more fatigue and body aches. She is allergic to multiple antibiotics, explained that we should hold off on that unless very sick, we will try treating with prednisone, resume singulair and xyzal, take mucinex and increase fluid intake.    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 Less stressed financially and is doing well now.   Hyperlipidemia: she is on life style modification only   Plantar fascitis: she had surgery a few weeks ago on left foot and is doing better   Patient Active Problem List   Diagnosis Date Noted  . History of kidney stones 04/12/2016  . Gastritis 10/08/2015  . Hiatal hernia 05/07/2015  . Arthralgia 05/07/2015  . Insomnia, persistent 03/21/2015  . Chronic LBP 03/21/2015  . Dyslipidemia 03/21/2015  . GERD  without esophagitis 03/21/2015  . H/O: hysterectomy 03/21/2015  . H/O Malignant melanoma 03/21/2015  . Depression, major, recurrent, mild (HCC) 03/21/2015  . Asthma, moderate persistent, poorly-controlled 03/21/2015  . Plantar fasciitis 03/21/2015  . B12 deficiency 03/21/2015  . Snores 03/21/2015  . Perennial allergic rhinitis with seasonal variation 03/21/2015  . Hyperlipidemia 03/21/2015    Past Surgical History:  Procedure Laterality Date  . ABDOMINAL HYSTERECTOMY    . CHOLECYSTECTOMY    . FUNCTIONAL ENDOSCOPIC SINUS SURGERY Bilateral   . PLANTAR FASCIA RELEASE Left 06/23/2017   Dr. Al CorpusHyatt  . REFRACTIVE SURGERY  2007  . SHOULDER SURGERY Left     Family History  Problem Relation Age of Onset  . Alzheimer's disease Mother   . Hypertension Mother   . Stroke Mother   . Migraines Mother   . Heart attack Father   . Heart disease Father   . Cancer Sister        Breast  . Stroke Maternal Aunt   . Diabetes Maternal Uncle   . Kidney cancer Neg Hx   . Prostate cancer Neg Hx     Social History   Socioeconomic History  . Marital status: Single    Spouse name: Not on file  . Number of children: Not on file  . Years of education: Not on file  . Highest education level: Not on file  Social Needs  . Financial  resource strain: Not on file  . Food insecurity - worry: Not on file  . Food insecurity - inability: Not on file  . Transportation needs - medical: Not on file  . Transportation needs - non-medical: Not on file  Occupational History    Employer: [employer]    Comment: contracted since retirement   Tobacco Use  . Smoking status: Never Smoker  . Smokeless tobacco: Never Used  Substance and Sexual Activity  . Alcohol use: No    Alcohol/week: 0.0 oz  . Drug use: No  . Sexual activity: Not Currently  Other Topics Concern  . 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  albuterol (PROVENTIL HFA;VENTOLIN HFA) 108 (90 Base) MCG/ACT inhaler, Inhale 2 puffs into the lungs every 6 (six) hours as needed for wheezing or shortness of breath., Disp: 1 Inhaler, Rfl: 0 .  BREO ELLIPTA 100-25 MCG/INH AEPB, INHALE 1 PUFF INTO THE LUNGS EVERY DAY, Disp: , Rfl: 2 .  buPROPion (WELLBUTRIN XL) 150 MG 24 hr tablet, Take 1 tablet (150 mg total) by mouth daily., Disp: 90 tablet, Rfl: 1 .  celecoxib (CELEBREX) 200 MG capsule, TAKE 1 CAPSULE (200 MG TOTAL) BY MOUTH ONCE DAILY., Disp: , Rfl: 1 .  escitalopram (LEXAPRO) 10 MG tablet, Take 1 tablet (10 mg total) by mouth daily., Disp: 90 tablet, Rfl: 1 .  esomeprazole (NEXIUM) 40 MG capsule, TAKE ONE CAPSULE BY MOUTH DAILY AT NOON, Disp: 90 capsule, Rfl: 1 .  estrogen-methylTESTOSTERone (ESTRATEST) 1.25-2.5 MG tablet, Take 1 tablet by mouth daily., Disp: , Rfl:  .  fluticasone (FLONASE) 50 MCG/ACT nasal spray, Place 2 sprays into both nostrils as needed., Disp: 16 g, Rfl: 2 .  Olopatadine HCl (PAZEO) 0.7 % SOLN, , Disp: , Rfl:  .  chlorpheniramine-HYDROcodone (TUSSIONEX PENNKINETIC ER) 10-8 MG/5ML SUER, Take 5 mLs by mouth at bedtime as needed., Disp: 140 mL, Rfl: 0 .  levocetirizine (XYZAL) 5 MG tablet, Take 1 tablet (5 mg total) by mouth every evening. (Patient not taking: Reported on 07/14/2017), Disp: 90 tablet, Rfl: 1 .  montelukast (SINGULAIR) 10 MG tablet, Take 1 tablet (10 mg total) by mouth at bedtime. (Patient not taking: Reported on 07/14/2017), Disp: 90 tablet, Rfl: 1 .  predniSONE (DELTASONE) 20 MG tablet, Take 1 tablet (20 mg total) by mouth daily with breakfast., Disp: 10 tablet, Rfl: 0  Allergies  Allergen Reactions  . Amoxicillin Rash  . Clindamycin/Lincomycin Rash  . Doxycycline Rash  . Levofloxacin Rash    rash rash  . Penicillins Rash     ROS  Constitutional: Negative for fever or weight change.  Respiratory: Positive  for cough and shortness of breath.   Cardiovascular: Negative for chest  pain or palpitations.  Gastrointestinal: Negative for abdominal pain, no bowel changes.  Musculoskeletal: Positive  for gait problem but no  joint swelling.  Skin: Negative for rash.  Neurological: Negative for dizziness or headache.  No other specific complaints in a complete review of systems (except as listed in HPI above).   Objective  Vitals:   07/14/17 1028  Height: 5\' 8"  (1.727 m)    Body mass index is 28.87 kg/m.  Physical Exam  Constitutional: Patient appears well-developed and well-nourished. Obese  No distress.  HEENT: head atraumatic, normocephalic, pupils equal and reactive to light, ears TM normal bilaterally,  neck supple, throat  within normal limits Cardiovascular: Normal rate, regular rhythm and normal heart sounds.  No murmur heard. No BLE edema. Pulmonary/Chest: Effort normal and breath sounds normal. No respiratory distress. Abdominal: Soft.  There is no tenderness. Psychiatric: Patient has a normal mood and affect. behavior is normal. Judgment and thought content normal.   PHQ2/9: Depression screen [facility] 2/9 07/14/2017 04/13/2017 11/18/2016 07/22/2016 04/12/2016  Decreased Interest 0 0 0 0 0  Down, Depressed, Hopeless 0 0 0 0 0  PHQ - 2 Score 0 0 0 0 0  Altered sleeping 0 - - - -  Tired, decreased energy 0 - - - -  Change in appetite 0 - - - -  Feeling bad or failure about yourself  0 - - - -  Trouble concentrating 0 - - - -  Moving slowly or fidgety/restless 0 - - - -  Suicidal thoughts 0 - - - -  PHQ-9 Score 0 - - - -  Difficult doing work/chores Not difficult at all - - - -     Fall Risk: Fall Risk  07/14/2017 04/13/2017 11/18/2016 07/22/2016 04/12/2016  Falls in the past year? No No No No No     Functional Status Survey: Is the patient deaf or have difficulty hearing?: No Does the patient have difficulty seeing, even when wearing glasses/contacts?: No Does the patient have difficulty concentrating, remembering, or making decisions?: No Does the  patient have difficulty walking or climbing stairs?: No Does the patient have difficulty dressing or bathing?: No Does the patient have difficulty doing errands alone such as visiting a doctor's office or shopping?: No    Assessment & Plan  1. Major depression in remission (HCC)  - buPROPion (WELLBUTRIN XL) 150 MG 24 hr tablet; Take 1 tablet (150 mg total) by mouth daily.  Dispense: 90 tablet; Refill: 1 - escitalopram (LEXAPRO) 10 MG tablet; Take 1 tablet (10 mg total) by mouth daily.  Dispense: 90 tablet; Refill: 1  2. Allergic asthma, moderate persistent, with acute exacerbation  - predniSONE (DELTASONE) 20 MG tablet; Take 1 tablet (20 mg total) by mouth daily with breakfast.  Dispense: 10 tablet; Refill: 0 - chlorpheniramine-HYDROcodone (TUSSIONEX PENNKINETIC ER) 10-8 MG/5ML SUER; Take 5 mLs by mouth at bedtime as needed.  Dispense: 140 mL; Refill: 0  3. Insomnia, persistent  She does not want medication for it, able to fall back asleep  4. GERD without esophagitis  Sees Dr. Mechele Collin, symptoms are under control at this time  5. Dyslipidemia  She is on life style modification only -lipid panel   6. GAD (generalized anxiety disorder)  She is back on 10 mg of Lexapro, back to her baseline

## 2017-07-18 ENCOUNTER — Telehealth: Payer: Self-pay | Admitting: Family Medicine

## 2017-07-18 MED ORDER — AZITHROMYCIN 250 MG PO TABS: ORAL_TABLET | ORAL | 0 refills | Status: DC

## 2017-07-18 NOTE — Telephone Encounter (Signed)
Sent zpack, return if no improvement

## 2017-07-18 NOTE — Telephone Encounter (Signed)
Copied from CRM [id number]. Topic: Quick Communication - See Telephone Encounter >> Jul 18, 2017 11:38 AM Oneal GroutSebastian, Jennifer S wrote: CRM for notification. See Telephone encounter for:  Patient was seen on Friday, not any better and is requesting a zpak be called in, [facility] [address] [facility].

## 2017-07-19 NOTE — Telephone Encounter (Signed)
Left voicemail to return if Zpak does not improve symptoms.

## 2017-07-24 ENCOUNTER — Ambulatory Visit: Payer: Commercial Managed Care - PPO | Admitting: Podiatry

## 2017-07-24 ENCOUNTER — Ambulatory Visit (INDEPENDENT_AMBULATORY_CARE_PROVIDER_SITE_OTHER): Payer: Commercial Managed Care - PPO | Admitting: Podiatry

## 2017-07-24 ENCOUNTER — Encounter: Payer: Self-pay | Admitting: Podiatry

## 2017-07-24 DIAGNOSIS — Z9889 Other specified postprocedural states: Secondary | ICD-10-CM

## 2017-07-24 DIAGNOSIS — M722 Plantar fascial fibromatosis: Secondary | ICD-10-CM

## 2017-07-24 MED ORDER — METHYLPREDNISOLONE 4 MG PO TBPK: ORAL_TABLET | ORAL | 0 refills | Status: DC

## 2017-07-24 NOTE — Progress Notes (Signed)
She presents today status post endoscopic plantar fasciotomy of the left foot date of surgery is June 23, 2017.  She states that she has heel pain along the plantar medial incision site and she also states that she has pain across the dorsal lateral aspect of the foot.  She has not been wearing orthotics and does not know where they are.  Objective: Vital signs are stable alert and oriented x3.  Pulses are palpable.  Has tenderness on palpation where the trocar went through the bottom of the heel on the medial aspect there is a scar there and it is painful on palpation.  More than likely is this that is resulting in lateral compensation and pain along the dorsal lateral aspect of the left foot.  Assessment: Flareup of her postsurgical status endoscopic fasciotomy.  Plan: I started her back on a Medrol Dosepak and instructed her on massage therapy.  If this does not resolve we will send her to physical therapy.  She may need to have another pair of orthotics made.

## 2017-08-14 ENCOUNTER — Ambulatory Visit: Payer: Commercial Managed Care - PPO | Admitting: Family Medicine

## 2017-08-14 ENCOUNTER — Ambulatory Visit (INDEPENDENT_AMBULATORY_CARE_PROVIDER_SITE_OTHER): Payer: Commercial Managed Care - PPO

## 2017-08-14 ENCOUNTER — Ambulatory Visit (INDEPENDENT_AMBULATORY_CARE_PROVIDER_SITE_OTHER): Payer: Commercial Managed Care - PPO | Admitting: Podiatry

## 2017-08-14 ENCOUNTER — Encounter: Payer: Self-pay | Admitting: Podiatry

## 2017-08-14 DIAGNOSIS — Z9889 Other specified postprocedural states: Secondary | ICD-10-CM

## 2017-08-14 DIAGNOSIS — M722 Plantar fascial fibromatosis: Secondary | ICD-10-CM

## 2017-08-14 DIAGNOSIS — M79672 Pain in left foot: Secondary | ICD-10-CM

## 2017-08-14 NOTE — Progress Notes (Signed)
She presents today date of surgery June 23, 2017.  She states that her heel is still more painful than it was before we did surgery on it.  She states that the plantar fascia however is doing much better she is very happy with that outcome.  She states that she has no longer any ankle pain or mid fascial pain.  He states that the majority of her pain is located in the calcaneus and I can hardly walk on it.  She relates back to [place].  She states that it has steadily gotten worse since that point.  Objective: Vital signs are stable alert and oriented x3.  Pulses are palpable.  She has pain on medial and lateral compression of the calcaneus radiographs taken today demonstrate sclerosis to the posterior and inferior aspect of the calcaneus which does appear to be a change from previous radiographs.  She has a large plantar distally oriented calcaneal heel spur which is present.  I am able to visualize the cut in the plantar fascia from the fasciotomy.  Assessment: I am concerned about a possible mass or fracture within the calcaneus at this point.  Well-healing endoscopic plantar fasciotomy chronic calcaneus pain.  Plan: I am requesting MRI with contrast for surgical consideration or mass-effect.

## 2017-08-16 ENCOUNTER — Telehealth: Payer: Self-pay | Admitting: Podiatry

## 2017-08-16 LAB — CBC WITH DIFFERENTIAL/PLATELET
BASOS PCT: 0.6 %
Basophils Absolute: 49 cells/uL (ref 0–200)
EOS ABS: 381 {cells}/uL (ref 15–500)
Eosinophils Relative: 4.7 %
HCT: 41 % (ref 35.0–45.0)
HEMOGLOBIN: 14.1 g/dL (ref 11.7–15.5)
Lymphs Abs: 1620 cells/uL (ref 850–3900)
MCH: 30.5 pg (ref 27.0–33.0)
MCHC: 34.4 g/dL (ref 32.0–36.0)
MCV: 88.7 fL (ref 80.0–100.0)
MPV: 10.2 fL (ref 7.5–12.5)
Monocytes Relative: 5.8 %
NEUTROS ABS: 5581 {cells}/uL (ref 1500–7800)
Neutrophils Relative %: 68.9 %
PLATELETS: 229 10*3/uL (ref 140–400)
RBC: 4.62 10*6/uL (ref 3.80–5.10)
RDW: 12.7 % (ref 11.0–15.0)
Total Lymphocyte: 20 %
WBC mixed population: 470 cells/uL (ref 200–950)
WBC: 8.1 10*3/uL (ref 3.8–10.8)

## 2017-08-16 LAB — COMPLETE METABOLIC PANEL WITH GFR
AG Ratio: 1.6 (calc) (ref 1.0–2.5)
ALBUMIN MSPROF: 4.1 g/dL (ref 3.6–5.1)
ALT: 42 U/L — ABNORMAL HIGH (ref 6–29)
AST: 26 U/L (ref 10–35)
Alkaline phosphatase (APISO): 105 U/L (ref 33–130)
BILIRUBIN TOTAL: 0.4 mg/dL (ref 0.2–1.2)
BUN: 19 mg/dL (ref 7–25)
CALCIUM: 9.5 mg/dL (ref 8.6–10.4)
CHLORIDE: 107 mmol/L (ref 98–110)
CO2: 28 mmol/L (ref 20–32)
CREATININE: 0.93 mg/dL (ref 0.50–1.05)
GFR, EST NON AFRICAN AMERICAN: 69 mL/min/{1.73_m2} (ref >=60)
GFR, Est African American: 80 mL/min/{1.73_m2} (ref >=60)
Globulin: 2.5 g/dL (calc) (ref 1.9–3.7)
Glucose, Bld: 90 mg/dL (ref 65–99)
Potassium: 4.7 mmol/L (ref 3.5–5.3)
Sodium: 142 mmol/L (ref 135–146)
TOTAL PROTEIN: 6.6 g/dL (ref 6.1–8.1)

## 2017-08-16 LAB — LIPID PANEL
CHOL/HDL RATIO: 3.5 (calc) (ref <5.0)
Cholesterol: 181 mg/dL (ref <200)
HDL: 51 mg/dL (ref >50)
LDL CHOLESTEROL (CALC): 110 mg/dL — AB
Non-HDL Cholesterol (Calc): 130 mg/dL (calc) — ABNORMAL HIGH (ref <130)
TRIGLYCERIDES: 98 mg/dL (ref <150)

## 2017-08-16 LAB — VITAMIN B12: VITAMIN B 12: 731 pg/mL (ref 200–1100)

## 2017-08-16 LAB — HEMOGLOBIN A1C
Hgb A1c MFr Bld: 5.6 % of total Hgb (ref <5.7)
Mean Plasma Glucose: 114 (calc)
eAG (mmol/L): 6.3 (calc)

## 2017-08-16 NOTE — Telephone Encounter (Signed)
Patient called to advise Dr. Al CorpusHyatt that she went to her PCP yesterday and he ordered blood work to include the kidney function that Dr. Al CorpusHyatt had requested that she have prior to the MRI. She went yesterday to [facility] draw station and had the labs performed. Just wanted to make Dr. Al CorpusHyatt aware of this.

## 2017-08-16 NOTE — Telephone Encounter (Signed)
Dr. Al CorpusHyatt has reviewed her BUN and Creatinine levels which are normal.  Ok to schedule MRI.

## 2017-08-17 ENCOUNTER — Telehealth: Payer: Self-pay

## 2017-08-17 ENCOUNTER — Other Ambulatory Visit: Payer: Self-pay | Admitting: Family Medicine

## 2017-08-17 NOTE — Addendum Note (Signed)
Addended by: Geraldine ContrasVENABLE, Maeson Purohit D on: 08/17/2017 05:07 PM   Modules accepted: Orders

## 2017-08-17 NOTE — Telephone Encounter (Signed)
Per Nann H. With [facility], MRI has been approved from 08/17/17 to 09/07/17 Auth # [id number]  Patient has been notified and she will call to schedule own appt.

## 2017-08-17 NOTE — Addendum Note (Signed)
Addended by: Geraldine ContrasVENABLE, Vail Vuncannon D on: 08/17/2017 02:31 PM   Modules accepted: Orders

## 2017-08-18 ENCOUNTER — Ambulatory Visit
Admission: RE | Admit: 2017-08-18 | Discharge: 2017-08-18 | Disposition: A | Discharge status: Home / Self Care | Payer: Commercial Managed Care - PPO | Source: Ambulatory Visit | Attending: Podiatry | Admitting: Podiatry

## 2017-08-18 DIAGNOSIS — R600 Localized edema: Secondary | ICD-10-CM | POA: Diagnosis not present

## 2017-08-18 DIAGNOSIS — M79672 Pain in left foot: Secondary | ICD-10-CM | POA: Insufficient documentation

## 2017-08-21 ENCOUNTER — Ambulatory Visit: Payer: Commercial Managed Care - PPO | Admitting: Podiatry

## 2017-08-21 ENCOUNTER — Telehealth: Payer: Self-pay | Admitting: *Deleted

## 2017-08-21 NOTE — Telephone Encounter (Signed)
Left message informing pt of Dr. Geryl RankinsHyatt's review and request to send copy of MRI disc to a radiology specialist for more indepth reading for treatment planning. [facility].

## 2017-08-21 NOTE — Telephone Encounter (Signed)
-----   Message from Elinor ParkinsonMax T Hyatt, [place] sent at 08/19/2017  7:54 AM EST ----- Sent for an over read and inform patient of the slight delay.  Present MRI appears to be normal.

## 2017-08-22 NOTE — Telephone Encounter (Signed)
I spoke with pt and she asked if Dr. Al CorpusHyatt had said what the MRI said and I told her that he said the MRI appeared normal, that was why he was sending it out for the special reading. Pt states understanding.

## 2017-08-22 NOTE — Telephone Encounter (Signed)
Pt called and stated she was returning my call.

## 2017-08-25 ENCOUNTER — Other Ambulatory Visit: Payer: Self-pay | Admitting: Family Medicine

## 2017-08-30 ENCOUNTER — Telehealth: Payer: Self-pay | Admitting: Podiatry

## 2017-08-30 NOTE — Telephone Encounter (Signed)
Patient called wanting to know if we have the report back for her MRI from [facility] yet?  Patient had the MRI on 08/18/17. She is in a lot of pain and wanted to make an appt with Dr Al CorpusHyatt for an injection on Monday until she can get the MRI results. Appt made for 09/04/17.  Advised her I would have the nurse check on the status of her disc with [facility] and advise.

## 2017-09-04 ENCOUNTER — Encounter: Payer: Self-pay | Admitting: Podiatry

## 2017-09-04 ENCOUNTER — Ambulatory Visit (INDEPENDENT_AMBULATORY_CARE_PROVIDER_SITE_OTHER): Payer: Commercial Managed Care - PPO | Admitting: Podiatry

## 2017-09-04 DIAGNOSIS — M722 Plantar fascial fibromatosis: Secondary | ICD-10-CM

## 2017-09-04 DIAGNOSIS — M7662 Achilles tendinitis, left leg: Secondary | ICD-10-CM | POA: Diagnosis not present

## 2017-09-04 NOTE — Progress Notes (Signed)
She presents today with excruciating heel pain.  She states that is not painful where we operated but it is painful on the very bottom of the heel and now starting to become painful in the outside of the heel and up the back of my leg.  She states that the pain is so severe sometimes my leg just quivers.  Objective: Vital signs are stable she is alert and oriented x3 surgical site is gone on to heal uneventfully and is no longer tender.  She has severe pain on direct palpation and plantar calcaneal tubercle.  Initial MRI come back negative.  I will send this for an over read and have already done so.  We do not have the results as of yet.  She also has pain on palpation of the lateral aspect of the left heel near the insertion site of the lateral fibers of the Achilles.  Assessment: Cannot rule out a Baxters neuritis.  She also has some insertional Achilles tendinitis.  Plan: Discussed etiology pathology conservative versus surgical therapies.  At this point I injected Kenalog and local anesthetic to the point of maximal tenderness plantar aspect of the heel.  A total of 20 mg was injected of Kenalog and 5 mg Marcaine.  This is done after sterile Betadine skin prep and verbal consent was provided.  Also injected 2 mg of dexamethasone to the point of maximal tenderness in the lateral aspect of the left heel after Betadine skin prep.  Tolerated procedure well.  Upon leaving the office she stated how much better she felt.  This leads me to believe more than likely this is a neuritis of some sort.  I suppose the lateral fibers of the plantar fascia central band and the lateral band could still be pulling on the bone causing renewed fasciitis.  I will await the over read and then we will discuss options.

## 2017-09-08 ENCOUNTER — Encounter: Payer: Self-pay | Admitting: Podiatry

## 2017-09-13 ENCOUNTER — Ambulatory Visit (INDEPENDENT_AMBULATORY_CARE_PROVIDER_SITE_OTHER): Payer: Commercial Managed Care - PPO | Admitting: Podiatry

## 2017-09-13 DIAGNOSIS — M722 Plantar fascial fibromatosis: Secondary | ICD-10-CM | POA: Diagnosis not present

## 2017-09-13 NOTE — Progress Notes (Signed)
She presents today for follow-up of her MRI.  She states that her foot is no better she states that the injection only lasted exactly 2 hours.  Objective: Vital signs are stable she is alert and oriented x3.  Pulses are palpable.  MRI demonstrates that there is still some fasciitis at the central band with traction on the calcaneus resulting in microfracturing of the calcaneus.  Plan: We discussed etiology pathology conservative versus surgical therapies this point we consented her for a total endoscopic plantar fasciotomy and then casting nonweightbearing.  She consented to this and signed all 3 page of the consent form.  She is also scanned for set of orthotics today.

## 2017-09-13 NOTE — Patient Instructions (Signed)
Pre-Operative Instructions  Congratulations, you have decided to take an important step towards improving your quality of life.  You can be assured that the doctors and staff at [facility] will be with you every step of the way.  Here are some important things you should know:  1. Plan to be at the surgery center/hospital at least 1 (one) hour prior to your scheduled time, unless otherwise directed by the surgical center/hospital staff.  You must have a responsible adult accompany you, remain during the surgery and drive you home.  Make sure you have directions to the surgical center/hospital to ensure you arrive on time. 2. If you are having surgery at [facility] or [facility], you will need a copy of your medical history and physical form from your family physician within one month prior to the date of surgery. We will give you a form for your primary physician to complete.  3. We make every effort to accommodate the date you request for surgery.  However, there are times where surgery dates or times have to be moved.  We will contact you as soon as possible if a change in schedule is required.   4. No aspirin/ibuprofen for one week before surgery.  If you are on aspirin, any non-steroidal anti-inflammatory medications (Mobic, Aleve, Ibuprofen) should not be taken seven (7) days prior to your surgery.  You make take Tylenol for pain prior to surgery.  5. Medications - If you are taking daily heart and blood pressure medications, seizure, reflux, allergy, asthma, anxiety, pain or diabetes medications, make sure you notify the surgery center/hospital before the day of surgery so they can tell you which medications you should take or avoid the day of surgery. 6. No food or drink after midnight the night before surgery unless directed otherwise by surgical center/hospital staff. 7. No alcoholic beverages 24-hours prior to surgery.  No smoking 24-hours prior or 24-hours after  surgery. 8. Wear loose pants or shorts. They should be loose enough to fit over bandages, boots, and casts. 9. Don't wear slip-on shoes. Sneakers are preferred. 10. Bring your boot with you to the surgery center/hospital.  Also bring crutches or a walker if your physician has prescribed it for you.  If you do not have this equipment, it will be provided for you after surgery. 11. If you have not been contacted by the surgery center/hospital by the day before your surgery, call to confirm the date and time of your surgery. 12. Leave-time from work may vary depending on the type of surgery you have.  Appropriate arrangements should be made prior to surgery with your employer. 13. Prescriptions will be provided immediately following surgery by your doctor.  Fill these as soon as possible after surgery and take the medication as directed. Pain medications will not be refilled on weekends and must be approved by the doctor. 14. Remove nail polish on the operative foot and avoid getting pedicures prior to surgery. 15. Wash the night before surgery.  The night before surgery wash the foot and leg well with water and the antibacterial soap provided. Be sure to pay special attention to beneath the toenails and in between the toes.  Wash for at least three (3) minutes. Rinse thoroughly with water and dry well with a towel.  Perform this wash unless told not to do so by your physician.  Enclosed: 1 Ice pack (please put in freezer the night before surgery)   1 Hibiclens skin cleaner     Pre-op instructions  If you have any questions regarding the instructions, please do not hesitate to call our office.  [place]: [address], [place], [place] [address] -- [phone]  [place]: [address] [place], [place] [address] -- [phone]  [place]: [address]  [place], [place] [address] -- [phone]  [place]: [address], [address], [place],  [address] -- [phone]  Website:  [email or web address] 

## 2017-09-18 ENCOUNTER — Telehealth: Payer: Self-pay | Admitting: *Deleted

## 2017-09-18 NOTE — Telephone Encounter (Signed)
"  I want to see if I can move up my surgery.  I'm scheduled for 09/29/2017.  My foot is killing me.  I don't think I can wait that long."  He can do it on February 15.  "That will be great.  Can you put me in the morning time?"  I cannot bump the people he already has scheduled.  "Okay, that's fine.  Put me down for then."  I'll call the surgical center and get it rescheduled.    I called Aram Beech[facility]Cynthia at [facility] and rescheduled the surgery from 09/29/2017 to 09/22/2017.

## 2017-09-20 ENCOUNTER — Other Ambulatory Visit: Payer: Self-pay | Admitting: Certified Nurse Midwife

## 2017-09-20 NOTE — Telephone Encounter (Signed)
Please advise for refills. Thank you! 

## 2017-09-21 ENCOUNTER — Other Ambulatory Visit: Payer: Self-pay | Admitting: Certified Nurse Midwife

## 2017-09-21 ENCOUNTER — Other Ambulatory Visit: Payer: Self-pay | Admitting: Podiatry

## 2017-09-21 ENCOUNTER — Telehealth: Payer: Self-pay | Admitting: *Deleted

## 2017-09-21 MED ORDER — PROMETHAZINE HCL 25 MG PO TABS: 25.0000 mg | ORAL_TABLET | Freq: Three times a day (TID) | ORAL | 0 refills | Status: DC | PRN

## 2017-09-21 MED ORDER — OXYCODONE-ACETAMINOPHEN 10-325 MG PO TABS: 1.0000 | ORAL_TABLET | ORAL | 0 refills | Status: DC | PRN

## 2017-09-21 MED ORDER — EST ESTROGENS-METHYLTEST 1.25-2.5 MG PO TABS: ORAL_TABLET | ORAL | 0 refills | Status: DC

## 2017-09-21 NOTE — Telephone Encounter (Signed)
I called to get authorization for the patient's surgery scheduled for 09/22/2017.  The surgery was authorized.  The authorization number is [id number].  I [facility] to [facility]ennifer at [facility].  The reference number for the call is [id number].

## 2017-09-22 ENCOUNTER — Encounter: Payer: Self-pay | Admitting: Podiatry

## 2017-09-22 DIAGNOSIS — M722 Plantar fascial fibromatosis: Secondary | ICD-10-CM | POA: Diagnosis not present

## 2017-09-25 ENCOUNTER — Encounter: Payer: Self-pay | Admitting: Podiatry

## 2017-09-25 ENCOUNTER — Ambulatory Visit (INDEPENDENT_AMBULATORY_CARE_PROVIDER_SITE_OTHER): Payer: Commercial Managed Care - PPO | Admitting: Podiatry

## 2017-09-25 VITALS — BP 124/83 | HR 83 | Temp 99.3°F | Resp 16

## 2017-09-25 DIAGNOSIS — Z9889 Other specified postprocedural states: Secondary | ICD-10-CM

## 2017-09-25 DIAGNOSIS — M722 Plantar fascial fibromatosis: Secondary | ICD-10-CM | POA: Diagnosis not present

## 2017-09-25 NOTE — Progress Notes (Signed)
She presents today for her first postop visit date of surgery last Friday, September 22, 2017 status post total EPF with cast.  She states that she is been doing very well until she got her cast wet yesterday.  She states that it was so wet she felt like she was needed to come in before it got infected.  She denies fever chills nausea vomiting muscle aches and pains states that her foot is felt 100% better since surgery.  Objective: Vital signs are stable alert and oriented x3.  Cast is intact but wet through and through.  Cast was removed today demonstrated no macerated tissue no erythema edema cellulitis drainage or odor sutures are intact margins well coapted no signs of infection.  Assessment: Well-healing surgical foot left.  Plan: Dry sterile compressive dressing was applied application of a below-knee cast was applied.

## 2017-09-27 ENCOUNTER — Telehealth: Payer: Self-pay | Admitting: Certified Nurse Midwife

## 2017-09-27 ENCOUNTER — Encounter: Payer: Commercial Managed Care - PPO | Admitting: Podiatry

## 2017-09-27 ENCOUNTER — Other Ambulatory Visit: Payer: Self-pay | Admitting: Certified Nurse Midwife

## 2017-09-27 MED ORDER — ESTRADIOL 1 MG PO TABS: 1.0000 mg | ORAL_TABLET | Freq: Every day | ORAL | 3 refills | Status: DC

## 2017-09-27 NOTE — Telephone Encounter (Signed)
Called patient and advised that Estratest no longer on her formulary. No substitute for this. DIscussed going on estradiol instead and she desires to try. Does not want to try transdermal. RX for estradiol 1 mgm sent to pharmacy. Farrel Connersolleen Markham Dumlao, CNM

## 2017-10-04 ENCOUNTER — Encounter: Payer: Self-pay | Admitting: Podiatry

## 2017-10-04 ENCOUNTER — Encounter: Payer: Commercial Managed Care - PPO | Admitting: Podiatry

## 2017-10-04 ENCOUNTER — Ambulatory Visit (INDEPENDENT_AMBULATORY_CARE_PROVIDER_SITE_OTHER): Payer: Commercial Managed Care - PPO | Admitting: Podiatry

## 2017-10-04 DIAGNOSIS — Z9889 Other specified postprocedural states: Secondary | ICD-10-CM

## 2017-10-04 DIAGNOSIS — M722 Plantar fascial fibromatosis: Secondary | ICD-10-CM | POA: Diagnosis not present

## 2017-10-04 NOTE — Progress Notes (Signed)
She presents today 2 weeks status post total endoscopic plantar fasciotomy left foot and cast application.  She denies fever chills nausea vomiting muscle aches and pains states that her foot is 100% pain-free.  Presents today with her knee scooter ambulating nonweightbearing.  Objective: Vital signs are stable alert oriented x3 dry sterile dressing intact was removed demonstrates no erythema edema cellulitis drainage or odor staples were removed sutures were removed margins remain well coapted no signs of infection.  Good range of motion of the ankle no pain on palpation medial calcaneal tubercle or on compression of the calcaneus.  No open lesions or wounds.  Assessment: Well-healing total endoscopic fasciotomy with cast application left.  Plan: We casted her today we will follow-up with her in 2 weeks for cast removal.  She will continue nonweightbearing stance.

## 2017-10-09 ENCOUNTER — Ambulatory Visit: Payer: Commercial Managed Care - PPO | Admitting: Family Medicine

## 2017-10-16 NOTE — Progress Notes (Signed)
Endoscopic plantar fasciotomy of the central and lateral bands left foot with cast application, below the knee.

## 2017-10-18 ENCOUNTER — Encounter: Payer: Self-pay | Admitting: Podiatry

## 2017-10-18 ENCOUNTER — Encounter: Payer: Commercial Managed Care - PPO | Admitting: Podiatry

## 2017-10-18 ENCOUNTER — Ambulatory Visit (INDEPENDENT_AMBULATORY_CARE_PROVIDER_SITE_OTHER): Payer: Commercial Managed Care - PPO | Admitting: Podiatry

## 2017-10-18 DIAGNOSIS — M722 Plantar fascial fibromatosis: Secondary | ICD-10-CM | POA: Diagnosis not present

## 2017-10-18 NOTE — Progress Notes (Signed)
She presents today 1 month status post total endoscopic plantar fasciotomy with cast left.  She states that it has been a shooting pain every once in a while but all in all it feels pretty good.  Objective: Vital signs are stable alert and oriented x3.  Pulses are palpable.  Cast was removed no open lesions or wounds.  Minimal tenderness on palpation of the surgical sites.  Calcaneus demonstrates no pain on palpation.  Assessment: Well-healing total transverse endoscopic plantar fasciotomy with cast.  Date of surgery 09/22/2017.  Plan: Cast removal today I am allow her to get back into her tennis shoes or her Cam walker which ever she feels best in request that she continue the night splint and I did dispense her orthotics today.

## 2017-10-18 NOTE — Patient Instructions (Signed)

## 2017-11-01 ENCOUNTER — Ambulatory Visit: Payer: Commercial Managed Care - PPO | Admitting: Family Medicine

## 2017-11-01 ENCOUNTER — Encounter: Payer: Self-pay | Admitting: Podiatry

## 2017-11-01 ENCOUNTER — Ambulatory Visit (INDEPENDENT_AMBULATORY_CARE_PROVIDER_SITE_OTHER): Payer: Commercial Managed Care - PPO | Admitting: Podiatry

## 2017-11-01 ENCOUNTER — Encounter: Payer: Self-pay | Admitting: Family Medicine

## 2017-11-01 VITALS — BP 120/70 | HR 75 | Resp 16 | Ht 68.0 in | Wt 203.5 lb

## 2017-11-01 DIAGNOSIS — F325 Major depressive disorder, single episode, in full remission: Secondary | ICD-10-CM | POA: Diagnosis not present

## 2017-11-01 DIAGNOSIS — F411 Generalized anxiety disorder: Secondary | ICD-10-CM

## 2017-11-01 DIAGNOSIS — E538 Deficiency of other specified B group vitamins: Secondary | ICD-10-CM

## 2017-11-01 DIAGNOSIS — M722 Plantar fascial fibromatosis: Secondary | ICD-10-CM

## 2017-11-01 DIAGNOSIS — E785 Hyperlipidemia, unspecified: Secondary | ICD-10-CM

## 2017-11-01 DIAGNOSIS — K219 Gastro-esophageal reflux disease without esophagitis: Secondary | ICD-10-CM | POA: Diagnosis not present

## 2017-11-01 DIAGNOSIS — J4540 Moderate persistent asthma, uncomplicated: Secondary | ICD-10-CM | POA: Diagnosis not present

## 2017-11-01 DIAGNOSIS — E669 Obesity, unspecified: Secondary | ICD-10-CM

## 2017-11-01 MED ORDER — MOMETASONE FURO-FORMOTEROL FUM 200-5 MCG/ACT IN AERO: 2.0000 | INHALATION_SPRAY | Freq: Two times a day (BID) | RESPIRATORY_TRACT | 5 refills | Status: DC

## 2017-11-01 MED ORDER — ALBUTEROL SULFATE HFA 108 (90 BASE) MCG/ACT IN AERS: 2.0000 | INHALATION_SPRAY | Freq: Four times a day (QID) | RESPIRATORY_TRACT | 0 refills | Status: DC | PRN

## 2017-11-01 MED ORDER — ALBUTEROL SULFATE (2.5 MG/3ML) 0.083% IN NEBU: 2.5000 mg | INHALATION_SOLUTION | Freq: Four times a day (QID) | RESPIRATORY_TRACT | 12 refills | Status: DC | PRN

## 2017-11-01 MED ORDER — NEBULIZER/ADULT MASK KIT: 1.0000 | PACK | Freq: Every day | 2 refills | Status: AC

## 2017-11-01 MED ORDER — ESOMEPRAZOLE MAGNESIUM 40 MG PO CPDR: DELAYED_RELEASE_CAPSULE | ORAL | 1 refills | Status: DC

## 2017-11-01 MED ORDER — NEBULIZER COMPRESSOR KIT: 1.0000 | PACK | Freq: Every day | 0 refills | Status: AC

## 2017-11-01 MED ORDER — ALBUTEROL SULFATE (2.5 MG/3ML) 0.083% IN NEBU
2.5000 mg | INHALATION_SOLUTION | Freq: Once | RESPIRATORY_TRACT | Status: AC
  Administered 2017-11-02: 2.5 mg via RESPIRATORY_TRACT

## 2017-11-01 MED ORDER — CYANOCOBALAMIN 1000 MCG/ML IJ SOLN
1000.0000 ug | Freq: Once | INTRAMUSCULAR | Status: AC
  Administered 2017-11-01: 1000 ug via INTRAMUSCULAR

## 2017-11-01 NOTE — Progress Notes (Signed)
She presents today for follow-up of her total endoscopic plantar fasciotomy date of surgery 09/22/2017.  She states that is still little tenderness on the one incision site but otherwise 100% pain-free.  Objective: Vital signs are stable she is alert and oriented x3 there is no erythema edema cellulitis drainage or odor she has no pain on.  Outpatient of the medial or the lateral calcaneal tubercle no pain on medial and lateral compression of the calcaneus.  Assessment: Resolving surgical pain.  Plan:

## 2017-11-01 NOTE — Progress Notes (Signed)
Name: Jill Sampson   MRN: [id number]    DOB: 1960/08/15   Date:11/01/2017       Progress Note  Subjective  Chief Complaint  Chief Complaint  Patient presents with  . Depression    HPI  GERD with esophagitis: she went to see Dr. Markham Jordan and had EGD, hiatal hernia. She denies heartburn or indigestion.  Taking Nexium daily and no side effects   AsthmaModerate poorly controlled: she has a daily dry cough , also has to clear her throat, she states Breo does not seem to be helping, but states seems to be triggered by allergies. Worse after she had to be intubated for foot surgery Feb 2019. She has been using Ventolin twice daily. No wheezing , but has occasional SOB  Major Depression in remission  she has a long history of depression. She is taking medication daily, no side effects, good mood, no crying spells and is in remission at this time  Hyperlipidemia: she is on life style modification only , last LDL was 110 , no chest pain.   Recent foot surgery: seen by Dr. Al Corpus, wearing tennis shoes again and recovering well, denies any pain at this time  B12 deficiency: she would like another injection today  Obesity: she has gained 13 lbs since Dec, she has not been able to exercise because of recent foot surgery, but states will resume diet and walks around the block  Patient Active Problem List   Diagnosis Date Noted  . History of kidney stones 04/12/2016  . Gastritis 10/08/2015  . Hiatal hernia 05/07/2015  . Arthralgia 05/07/2015  . Insomnia, persistent 03/21/2015  . Chronic LBP 03/21/2015  . Dyslipidemia 03/21/2015  . GERD without esophagitis 03/21/2015  . H/O: hysterectomy 03/21/2015  . H/O Malignant melanoma 03/21/2015  . Asthma, moderate persistent, poorly-controlled 03/21/2015  . Plantar fasciitis 03/21/2015  . B12 deficiency 03/21/2015  . Snores 03/21/2015  . Perennial allergic rhinitis with seasonal variation 03/21/2015  . Hyperlipidemia 03/21/2015    Past  Surgical History:  Procedure Laterality Date  . ABDOMINAL HYSTERECTOMY    . CHOLECYSTECTOMY    . FUNCTIONAL ENDOSCOPIC SINUS SURGERY Bilateral   . PLANTAR FASCIA RELEASE Left 06/23/2017   Dr. Al Corpus  . REFRACTIVE SURGERY  2007  . SHOULDER SURGERY Left     Family History  Problem Relation Age of Onset  . Alzheimer's disease Mother   . Hypertension Mother   . Stroke Mother   . Migraines Mother   . Heart attack Father   . Heart disease Father   . Cancer Sister        Breast  . Stroke Maternal Aunt   . Diabetes Maternal Uncle   . Kidney cancer Neg Hx   . Prostate cancer Neg Hx     Social History   Socioeconomic History  . Marital status: Single    Spouse name: Not on file  . Number of children: Not on file  . Years of education: Not on file  . Highest education level: Not on file  Occupational History    Employer: [employer]    Comment: contracted since retirement   Social Needs  . Financial resource strain: Not on file  . Food insecurity:    Worry: Not on file    Inability: Not on file  . Transportation needs:    Medical: Not on file    Non-medical: Not on file  Tobacco Use  . Smoking status: Never Smoker  . Smokeless tobacco: Never Used  Substance and Sexual Activity  . Alcohol use: No    Alcohol/week: 0.0 oz  . Drug use: No  . Sexual activity: Not Currently  Lifestyle  . Physical activity:    Days per week: Not on file    Minutes per session: Not on file  . Stress: Not on file  Relationships  . Social connections:    Talks on phone: Not on file    Gets together: Not on file    Attends religious service: Not on file    Active member of club or organization: Not on file    Attends meetings of clubs or organizations: Not on file    Relationship status: Not on file  . Intimate partner violence:    Fear of current or ex partner: Not on file    Emotionally abused: Not on file    Physically abused: Not on file    Forced sexual activity: Not on file  Other  Topics Concern  . 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  albuterol (PROVENTIL HFA;VENTOLIN HFA) 108 (90 Base) MCG/ACT inhaler, Inhale 2 puffs into the lungs every 6 (six) hours as needed for wheezing or shortness of breath., Disp: 1 Inhaler, Rfl: 0 .  buPROPion (WELLBUTRIN XL) 150 MG 24 hr tablet, Take 1 tablet (150 mg total) by mouth daily., Disp: 90 tablet, Rfl: 1 .  escitalopram (LEXAPRO) 10 MG tablet, Take 1 tablet (10 mg total) by mouth daily., Disp: 90 tablet, Rfl: 1 .  esomeprazole (NEXIUM) 40 MG capsule, TAKE ONE CAPSULE BY MOUTH DAILY AT NOON, Disp: 90 capsule, Rfl: 1 .  estradiol (ESTRACE) 1 MG tablet, Take 1 tablet (1 mg total) by mouth daily., Disp: 30 tablet, Rfl: 3 .  fluticasone (FLONASE) 50 MCG/ACT nasal spray, Place 2 sprays into both nostrils as needed., Disp: 16 g, Rfl: 2 .  levocetirizine (XYZAL) 5 MG tablet, Take 1 tablet (5 mg total) by mouth every evening., Disp: 90 tablet, Rfl: 1 .  Olopatadine HCl (PAZEO) 0.7 % SOLN, , Disp: , Rfl:   Allergies  Allergen Reactions  . Amoxicillin Rash  . Clindamycin/Lincomycin Rash  . Doxycycline Rash  . Levofloxacin Rash    rash rash  . Penicillins Rash     ROS  Constitutional: Negative for fever , positive for  weight change.  Respiratory: Positive  for cough and shortness of breath.   Cardiovascular: Negative for chest pain or palpitations.  Gastrointestinal: Negative for abdominal pain, no bowel changes.  Musculoskeletal: Negative for gait problem or joint swelling.  Skin: Negative for rash.  Neurological: Negative for dizziness or headache.  No other specific complaints in a complete review of systems (except as listed in HPI above).  Objective  Vitals:   11/01/17 1456  BP: 120/70  Pulse: 75  Resp: 16  SpO2: 96%  Weight: 203 lb 8 oz (92.3 kg)  Height: 5\' 8"  (1.727 m)    Body mass index is  30.94 kg/m.  Physical Exam  Constitutional: Patient appears well-developed and well-nourished. Obese  No distress.  HEENT: head atraumatic, normocephalic, pupils equal and reactive to light,  neck supple, throat within normal limitsCardiovascular: Normal rate, regular rhythm and normal heart sounds.  No murmur heard. No BLE edema. Pulmonary/Chest: Effort normal and breath sounds normal. No respiratory distress. Abdominal: Soft.  There is no tenderness. Psychiatric: Patient has  a normal mood and affect. behavior is normal. Judgment and thought content normal. Muscular Skeletal: normal gait, no limps, seen by Dr. Al Corpus today   Recent Results (from the past 2160 hour(s))  CBC with Differential/Platelet     Status: None   Collection Time: 08/15/17  8:59 AM  Result Value Ref Range   WBC 8.1 3.8 - 10.8 Thousand/uL   RBC 4.62 3.80 - 5.10 Million/uL   Hemoglobin 14.1 11.7 - 15.5 g/dL   HCT 16.1 09.6 - 04.5 %   MCV 88.7 80.0 - 100.0 fL   MCH 30.5 27.0 - 33.0 pg   MCHC 34.4 32.0 - 36.0 g/dL   RDW 40.9 81.1 - 91.4 %   Platelets 229 140 - 400 Thousand/uL   MPV 10.2 7.5 - 12.5 fL   Neutro Abs 5,581 1,500 - 7,800 cells/uL   Lymphs Abs 1,620 850 - 3,900 cells/uL   WBC mixed population 470 200 - 950 cells/uL   Eosinophils Absolute 381 15 - 500 cells/uL   Basophils Absolute 49 0 - 200 cells/uL   Neutrophils Relative % 68.9 %   Total Lymphocyte 20.0 %   Monocytes Relative 5.8 %   Eosinophils Relative 4.7 %   Basophils Relative 0.6 %  COMPLETE METABOLIC PANEL WITH GFR     Status: Abnormal   Collection Time: 08/15/17  8:59 AM  Result Value Ref Range   Glucose, Bld 90 65 - 99 mg/dL    Comment: .            Fasting reference interval .    BUN 19 7 - 25 mg/dL   Creat 7.82 9.56 - 2.13 mg/dL    Comment: For patients >69 years of age, the reference limit for Creatinine is approximately 13% higher for people identified as African-American. .    GFR, Est Non African American 69 > OR = 60  mL/min/1.40m2   GFR, Est African American 80 > OR = 60 mL/min/1.10m2   BUN/Creatinine Ratio NOT APPLICABLE 6 - 22 (calc)   Sodium 142 135 - 146 mmol/L   Potassium 4.7 3.5 - 5.3 mmol/L   Chloride 107 98 - 110 mmol/L   CO2 28 20 - 32 mmol/L   Calcium 9.5 8.6 - 10.4 mg/dL   Total Protein 6.6 6.1 - 8.1 g/dL   Albumin 4.1 3.6 - 5.1 g/dL   Globulin 2.5 1.9 - 3.7 g/dL (calc)   AG Ratio 1.6 1.0 - 2.5 (calc)   Total Bilirubin 0.4 0.2 - 1.2 mg/dL   Alkaline phosphatase (APISO) 105 33 - 130 U/L   AST 26 10 - 35 U/L   ALT 42 (H) 6 - 29 U/L  Hemoglobin A1c     Status: None   Collection Time: 08/15/17  8:59 AM  Result Value Ref Range   Hgb A1c MFr Bld 5.6 <5.7 % of total Hgb    Comment: For the purpose of screening for the presence of diabetes: . <5.7%       Consistent with the absence of diabetes 5.7-6.4%    Consistent with increased risk for diabetes             (prediabetes) > or =6.5%  Consistent with diabetes . This assay result is consistent with a decreased risk of diabetes. . Currently, no consensus exists regarding use of hemoglobin A1c for diagnosis of diabetes in children. . According to American Diabetes Association (ADA) guidelines, hemoglobin A1c <7.0% represents optimal control in non-pregnant diabetic patients. Different metrics may apply to  specific patient populations.  Standards of Medical Care in Diabetes(ADA). .    Mean Plasma Glucose 114 (calc)   eAG (mmol/L) 6.3 (calc)  Vitamin B12     Status: None   Collection Time: 08/15/17  8:59 AM  Result Value Ref Range   Vitamin B-12 731 200 - 1,100 pg/mL  Lipid panel     Status: Abnormal   Collection Time: 08/15/17  8:59 AM  Result Value Ref Range   Cholesterol 181 <200 mg/dL   HDL 51 >16 mg/dL   Triglycerides 98 <109 mg/dL   LDL Cholesterol (Calc) 110 (H) mg/dL (calc)    Comment: Reference range: <100 . Desirable range <100 mg/dL for primary prevention;   <70 mg/dL for patients with CHD or diabetic patients   with > or = 2 CHD risk factors. Marland Kitchen LDL-C is now calculated using the Martin-Hopkins  calculation, which is a validated novel method providing  better accuracy than the Friedewald equation in the  estimation of LDL-C.  Horald Pollen et al. Lenox Ahr. 6045;409(81): 2061-2068  (http://education.[facility].com/faq/FAQ164)    Total CHOL/HDL Ratio 3.5 <5.0 (calc)   Non-HDL Cholesterol (Calc) 130 (H) <130 mg/dL (calc)    Comment: For patients with diabetes plus 1 major ASCVD risk  factor, treating to a non-HDL-C goal of <100 mg/dL  (LDL-C of <19 mg/dL) is considered a therapeutic  option.       PHQ2/9: Depression screen [facility] 2/9 11/01/2017 07/14/2017 04/13/2017 11/18/2016 07/22/2016  Decreased Interest 0 0 0 0 0  Down, Depressed, Hopeless 0 0 0 0 0  PHQ - 2 Score 0 0 0 0 0  Altered sleeping 0 0 - - -  Tired, decreased energy 0 0 - - -  Change in appetite 0 0 - - -  Feeling bad or failure about yourself  0 0 - - -  Trouble concentrating 0 0 - - -  Moving slowly or fidgety/restless 0 0 - - -  Suicidal thoughts 0 0 - - -  PHQ-9 Score 0 0 - - -  Difficult doing work/chores - Not difficult at all - - -     Fall Risk: Fall Risk  11/01/2017 07/14/2017 04/13/2017 11/18/2016 07/22/2016  Falls in the past year? No No No No No     Functional Status Survey: Is the patient deaf or have difficulty hearing?: No Does the patient have difficulty seeing, even when wearing glasses/contacts?: No Does the patient have difficulty concentrating, remembering, or making decisions?: No Does the patient have difficulty walking or climbing stairs?: No(Just healing from foot surgery.) Does the patient have difficulty dressing or bathing?: No Does the patient have difficulty doing errands alone such as visiting a doctor's office or shopping?: No    Assessment & Plan   1. Asthma, moderate persistent, poorly-controlled  She would like to go back on Dulera since [place] does not seem to be working well -  albuterol (PROVENTIL HFA;VENTOLIN HFA) 108 (90 Base) MCG/ACT inhaler; Inhale 2 puffs into the lungs every 6 (six) hours as needed for wheezing or shortness of breath.  Dispense: 1 Inhaler; Refill: 0 - mometasone-formoterol (DULERA) 200-5 MCG/ACT AERO; Inhale 2 puffs into the lungs 2 (two) times daily.  Dispense: 13 g; Refill: 5 - spirometry FEvi 74% and no improvement with albuterol, but patient felt better and wants to continue medication. She asked for albuterol and machine to use it at home  2. 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 GERD without esophagitis  - esomeprazole (NEXIUM) 40 MG capsule; TAKE ONE CAPSULE BY MOUTH DAILY AT NOON  Dispense: 90 capsule; Refill: 1  4. Dyslipidemia  Last LDL was at goal for her  5. GAD (generalized anxiety disorder)  Doing well   6. B12 deficiency  - cyanocobalamin ((VITAMIN B-12)) injection 1,000 mcg  7. Obesity (BMI 30.0-34.9)  Discussed with the patient the risk posed by an increased BMI. Discussed importance of portion control, calorie counting and at least 150 minutes of physical activity weekly. Avoid sweet beverages and drink more water. Eat at least 6 servings of fruit and vegetables daily

## 2017-11-02 DIAGNOSIS — J4540 Moderate persistent asthma, uncomplicated: Secondary | ICD-10-CM | POA: Diagnosis not present

## 2017-11-06 ENCOUNTER — Telehealth: Payer: Self-pay | Admitting: Family Medicine

## 2017-11-06 ENCOUNTER — Encounter: Payer: Commercial Managed Care - PPO | Admitting: Podiatry

## 2017-11-06 NOTE — Telephone Encounter (Signed)
Please place order and I can either print to fax to send to pharmacy by e-rx

## 2017-11-06 NOTE — Telephone Encounter (Signed)
Copied from CRM [phone]. Topic: Quick Communication - Rx Refill/Question >> Nov 06, 2017  2:49 PM Leafy Roobinson, Norma J wrote: Medication: nebulizer machine Has the patient contacted their pharmacy?yes. Pt last seen dr Carlynn Purlsowles on 11-01-17. [facility] [phone]. Phone [phone]. Pt got this place off website [email or web address]

## 2017-11-07 NOTE — Telephone Encounter (Signed)
I'm unable to find Nebulizer machine on Epic will write out a prescription pad for you to sign and will [facility] to [facility] at [phone].

## 2017-11-08 ENCOUNTER — Telehealth: Payer: Self-pay | Admitting: Family Medicine

## 2017-11-08 MED ORDER — PREDNISONE 10 MG PO TABS: 10.0000 mg | ORAL_TABLET | Freq: Two times a day (BID) | ORAL | 0 refills | Status: DC

## 2017-11-08 NOTE — Telephone Encounter (Signed)
Do they pay for symbicort or Breo?  I will send prednisone 10 for 5 days for her asthma flare

## 2017-11-08 NOTE — Telephone Encounter (Signed)
Copied from CRM [phone]. Topic: Quick Communication - See Telephone Encounter >> Nov 08, 2017  8:25 AM Everardo PacificMoton, Jasminemarie Sherrard, NT wrote: CRM for notification. Patient calling because she would like to know if Dr. Carlynn PurlSowles could call her in some medication for her bronchitis that she has been dealing with for a week. Patient would like to have a Z-PAC with the 3 pills that are 500 mg She would [facility] [place] [address] [phone]. Also she would like to let her know as well that her insurance no longer will cover her Dulera inhaler and she will be needed a different inhaler also. Patient would like a call back to let her know if Dr.Sowles will be willing to do these things for her. She can be reached at [phone]

## 2017-11-08 NOTE — Telephone Encounter (Signed)
Message has been routed to PCP.

## 2017-11-09 ENCOUNTER — Ambulatory Visit
Admission: EM | Admit: 2017-11-09 | Discharge: 2017-11-09 | Disposition: A | Discharge status: Home / Self Care | Payer: Commercial Managed Care - PPO | Attending: Family Medicine | Admitting: Family Medicine

## 2017-11-09 ENCOUNTER — Other Ambulatory Visit: Payer: Self-pay | Admitting: Family Medicine

## 2017-11-09 ENCOUNTER — Other Ambulatory Visit: Payer: Self-pay

## 2017-11-09 DIAGNOSIS — R509 Fever, unspecified: Secondary | ICD-10-CM

## 2017-11-09 DIAGNOSIS — J029 Acute pharyngitis, unspecified: Secondary | ICD-10-CM | POA: Diagnosis not present

## 2017-11-09 DIAGNOSIS — R05 Cough: Secondary | ICD-10-CM | POA: Diagnosis not present

## 2017-11-09 DIAGNOSIS — J988 Other specified respiratory disorders: Secondary | ICD-10-CM | POA: Diagnosis not present

## 2017-11-09 LAB — RAPID STREP SCREEN (MED CTR MEBANE ONLY): Streptococcus, Group A Screen (Direct): NEGATIVE

## 2017-11-09 MED ORDER — FLUTICASONE FUROATE-VILANTEROL 100-25 MCG/INH IN AEPB: 1.0000 | INHALATION_SPRAY | Freq: Every day | RESPIRATORY_TRACT | 0 refills | Status: DC

## 2017-11-09 MED ORDER — AZITHROMYCIN 250 MG PO TABS: ORAL_TABLET | ORAL | 0 refills | Status: DC

## 2017-11-09 MED ORDER — HYDROCOD POLST-CPM POLST ER 10-8 MG/5ML PO SUER: 5.0000 mL | Freq: Two times a day (BID) | ORAL | 0 refills | Status: DC | PRN

## 2017-11-09 NOTE — Discharge Instructions (Signed)
Azithromycin and Tussionex as prescribed.  Take care  Dr. Adriana Simasook

## 2017-11-09 NOTE — ED Triage Notes (Signed)
Patient complains of sinus pain and pressure, fever, coughing, congestion and sore throat that started on Sunday. Patient states that she has not been improving.

## 2017-11-09 NOTE — Telephone Encounter (Addendum)
She was upset about the Z-pac. She had to go to UC because of all the coughing and mucous she was having. Her insurance does pay for Breo. Please send to [facility] [address]

## 2017-11-09 NOTE — ED Provider Notes (Signed)
[facility]    CSN: [id number] Arrival date & time: 11/09/17  3903  History   Chief Complaint Chief Complaint  Patient presents with  . Sore Throat   HPI  57 year old female presents with respiratory symptoms.  Patient states his been sick since Sunday.  She states she has had sore throat, cough, sinus pain and drainage.  She states that she has discolored nasal discharge as well as discolored sputum.  She states she has had fever.  Fever last night was 100.  She is not improving.  She feels like has a sinus infection as well as bronchitis.  She states that this happens to her at least once a year and she responds well to azithromycin and Tussionex.  She is requesting this today.  Past Medical History:  Diagnosis Date  . Allergy   . Anxiety   . Asthma   . Chronic insomnia   . Depression   . Dyslipidemia   . Esophagitis   . Gastritis   . GERD (gastroesophageal reflux disease)   . History of melanoma   . Low back pain   . Panic attack   . Snoring   . Vitamin B 12 deficiency     Patient Active Problem List   Diagnosis Date Noted  . History of kidney stones 04/12/2016  . Gastritis 10/08/2015  . Hiatal hernia 05/07/2015  . Arthralgia 05/07/2015  . Insomnia, persistent 03/21/2015  . Chronic LBP 03/21/2015  . Dyslipidemia 03/21/2015  . GERD without esophagitis 03/21/2015  . H/O: hysterectomy 03/21/2015  . H/O Malignant melanoma 03/21/2015  . Asthma, moderate persistent, poorly-controlled 03/21/2015  . Plantar fasciitis 03/21/2015  . B12 deficiency 03/21/2015  . Snores 03/21/2015  . Perennial allergic rhinitis with seasonal variation 03/21/2015  . Hyperlipidemia 03/21/2015    Past Surgical History:  Procedure Laterality Date  . ABDOMINAL HYSTERECTOMY    . CHOLECYSTECTOMY    . FUNCTIONAL ENDOSCOPIC SINUS SURGERY Bilateral   . PLANTAR FASCIA RELEASE Left 06/23/2017   Dr. Milinda Pointer  . REFRACTIVE SURGERY  2007  . SHOULDER SURGERY Left     OB History     None      Home Medications    Prior to Admission medications   Medication Sig Start Date End Date Taking? Authorizing Provider  albuterol (PROVENTIL HFA;VENTOLIN HFA) 108 (90 Base) MCG/ACT inhaler Inhale 2 puffs into the lungs every 6 (six) hours as needed for wheezing or shortness of breath. 11/01/17  Yes Sowles, Drue Stager, MD  albuterol (PROVENTIL) (2.5 MG/3ML) 0.083% nebulizer solution Take 3 mLs (2.5 mg total) by nebulization every 6 (six) hours as needed for wheezing or shortness of breath. Do not use with [facility] 11/01/17  Yes Sowles, Drue Stager, MD  buPROPion (WELLBUTRIN XL) 150 MG 24 hr tablet Take 1 tablet (150 mg total) by mouth daily. 07/14/17  Yes Sowles, Drue Stager, MD  escitalopram (LEXAPRO) 10 MG tablet Take 1 tablet (10 mg total) by mouth daily. 07/14/17  Yes Sowles, Drue Stager, MD  esomeprazole (NEXIUM) 40 MG capsule TAKE ONE CAPSULE BY MOUTH DAILY AT NOON 11/01/17  Yes Sowles, Drue Stager, MD  estradiol (ESTRACE) 1 MG tablet Take 1 tablet (1 mg total) by mouth daily. 09/27/17  Yes Dalia Heading, CNM  fluticasone (FLONASE) 50 MCG/ACT nasal spray Place 2 sprays into both nostrils as needed. 04/13/17  Yes Sowles, Drue Stager, MD  levocetirizine (XYZAL) 5 MG tablet Take 1 tablet (5 mg total) by mouth every evening. 04/13/17  Yes Steele Sizer, MD  mometasone-formoterol [facility]) 200-5 MCG/ACT AERO  Inhale 2 puffs into the lungs 2 (two) times daily. 11/01/17  Yes Sowles, Drue Stager, MD  Olopatadine HCl (PAZEO) 0.7 % SOLN  07/29/15  Yes [provider]  predniSONE (DELTASONE) 10 MG tablet Take 1 tablet (10 mg total) by mouth 2 (two) times daily with a meal. 11/08/17  Yes Sowles, Drue Stager, MD  Respiratory Therapy Supplies (NEBULIZER COMPRESSOR) KIT 1 each by Does not apply route daily. 11/01/17  Yes Steele Sizer, MD  Respiratory Therapy Supplies (NEBULIZER/ADULT MASK) KIT 1 each by Does not apply route daily. 11/01/17  Yes Sowles, Drue Stager, MD  azithromycin (ZITHROMAX) 250 MG tablet 2 tablets on day 1, then  1 tablet daily on days 2-5. 11/09/17   Jill Spikes, DO  chlorpheniramine-HYDROcodone (TUSSIONEX PENNKINETIC ER) 10-8 MG/5ML SUER Take 5 mLs by mouth every 12 (twelve) hours as needed. 11/09/17   Jill Spikes, DO    Family History Family History  Problem Relation Age of Onset  . Alzheimer's disease Mother   . Hypertension Mother   . Stroke Mother   . Migraines Mother   . Heart attack Father   . Heart disease Father   . Cancer Sister        Breast  . Stroke Maternal Aunt   . Diabetes Maternal Uncle   . Kidney cancer Neg Hx   . Prostate cancer Neg Hx     Social History Social History   Tobacco Use  . Smoking status: Never Smoker  . Smokeless tobacco: Never Used  Substance Use Topics  . Alcohol use: No    Alcohol/week: 0.0 oz  . Drug use: No     Allergies   Amoxicillin; Clindamycin/lincomycin; Doxycycline; Levofloxacin; and Penicillins   Review of Systems Review of Systems  Constitutional: Positive for fever.  HENT: Positive for sinus pain and sore throat.   Respiratory: Positive for cough.    Physical Exam Triage Vital Signs ED Triage Vitals  Enc Vitals Group     BP 11/09/17 0951 105/76     Pulse Rate 11/09/17 0951 75     Resp 11/09/17 0951 17     Temp 11/09/17 0951 98.6 F (37 C)     Temp Source 11/09/17 0951 Oral     SpO2 11/09/17 0951 97 %     Weight 11/09/17 0949 203 lb (92.1 kg)     Height 11/09/17 0949 5' 8"  (1.727 m)     Head Circumference --      Peak Flow --      Pain Score 11/09/17 0949 4     Pain Loc --      Pain Edu? --      Excl. in [place]? --    Updated Vital Signs BP 105/76 (BP Location: Left Arm)   Pulse 75   Temp 98.6 F (37 C) (Oral)   Resp 17   Ht 5' 8"  (1.727 m)   Wt 203 lb (92.1 kg)   SpO2 97%   BMI 30.87 kg/m   Physical Exam  Constitutional: She appears well-developed. No distress.  HENT:  Head: Normocephalic and atraumatic.  Nose: Nose normal.  Mouth/Throat: Oropharynx is clear and moist.  Eyes: Conjunctivae are normal.  Right eye exhibits no discharge. Left eye exhibits no discharge.  Cardiovascular: Normal rate and regular rhythm.  Pulmonary/Chest: Effort normal and breath sounds normal. She has no wheezes. She has no rales.  Neurological: She is alert.  Psychiatric: She has a normal mood and affect. Her behavior is normal.  Nursing note and  vitals reviewed.  UC Treatments / Results  Labs (all labs ordered are listed, but only abnormal results are displayed) Labs Reviewed  RAPID STREP SCREEN (NOT AT [facility])  CULTURE, GROUP A STREP [facility])    EKG None Radiology No results found.  Procedures Procedures (including critical care time)  Medications Ordered in UC Medications - No data to display   Initial Impression / Assessment and Plan / UC Course  I have reviewed the triage vital signs and the nursing notes.  Pertinent labs & imaging results that were available during my care of the patient were reviewed by me and considered in my medical decision making (see chart for details).     57 year old female presents with a respiratory infection.  Treating as below.  Final Clinical Impressions(s) / UC Diagnoses   Final diagnoses:  Respiratory infection    ED Discharge Orders        Ordered    azithromycin (ZITHROMAX) 250 MG tablet     11/09/17 1015    chlorpheniramine-HYDROcodone (TUSSIONEX PENNKINETIC ER) 10-8 MG/5ML SUER  Every 12 hours PRN     11/09/17 1015     Controlled Substance Prescriptions [place] Controlled Substance Registry consulted? Not Applicable   Jill Spikes, DO 11/09/17 1020

## 2017-11-11 LAB — CULTURE, GROUP A STREP (THRC)

## 2017-11-13 ENCOUNTER — Telehealth (HOSPITAL_COMMUNITY): Payer: Self-pay

## 2017-11-13 NOTE — Telephone Encounter (Signed)
Attempted to reach patient regarding results from recent visit. No answer at this time, voicemail left. Need to educate pt on results being within normal limits.  

## 2017-11-17 ENCOUNTER — Ambulatory Visit: Payer: Commercial Managed Care - PPO | Admitting: Nurse Practitioner

## 2017-11-25 ENCOUNTER — Other Ambulatory Visit: Payer: Self-pay | Admitting: Family Medicine

## 2017-11-25 DIAGNOSIS — J4540 Moderate persistent asthma, uncomplicated: Secondary | ICD-10-CM

## 2017-12-04 ENCOUNTER — Encounter: Payer: Commercial Managed Care - PPO | Admitting: Podiatry

## 2017-12-12 ENCOUNTER — Other Ambulatory Visit: Payer: Self-pay | Admitting: Family Medicine

## 2017-12-12 DIAGNOSIS — F325 Major depressive disorder, single episode, in full remission: Secondary | ICD-10-CM

## 2017-12-12 NOTE — Telephone Encounter (Signed)
Refill request for general medication: Wellbutrin XL 150 mg Lexapro 10 mg  Last office visit: 11/01/2017  Last physical exam: None indicated   Follow-ups on file. 02/01/2018

## 2017-12-22 ENCOUNTER — Other Ambulatory Visit: Payer: Self-pay | Admitting: Certified Nurse Midwife

## 2018-01-02 ENCOUNTER — Other Ambulatory Visit: Payer: Self-pay | Admitting: Family Medicine

## 2018-01-02 DIAGNOSIS — J4540 Moderate persistent asthma, uncomplicated: Secondary | ICD-10-CM

## 2018-02-01 ENCOUNTER — Ambulatory Visit: Payer: Commercial Managed Care - PPO | Admitting: Family Medicine

## 2018-03-22 ENCOUNTER — Other Ambulatory Visit: Payer: Self-pay | Admitting: Family Medicine

## 2018-03-22 DIAGNOSIS — F325 Major depressive disorder, single episode, in full remission: Secondary | ICD-10-CM

## 2018-03-22 NOTE — Telephone Encounter (Signed)
She needs follow up, I will send one month supply only

## 2018-04-26 ENCOUNTER — Other Ambulatory Visit: Payer: Self-pay | Admitting: Family Medicine

## 2018-04-26 DIAGNOSIS — F325 Major depressive disorder, single episode, in full remission: Secondary | ICD-10-CM

## 2018-04-26 NOTE — Telephone Encounter (Signed)
Left voice message [phone] @ 9:09 informing patient that Dr Carlynn PurlSowles is willing to see him today at 11:20

## 2018-04-26 NOTE — Telephone Encounter (Signed)
Can I see her today, at lunch?

## 2018-04-27 NOTE — Telephone Encounter (Signed)
It will not let me close this chart, will you close it for me. Thank you

## 2018-04-27 NOTE — Telephone Encounter (Signed)
Tried contacting pt again this morning because Dr Carlynn PurlSowles had one opening available. Left voice message

## 2018-04-27 NOTE — Telephone Encounter (Signed)
Pt states she is at the beach on vacation, so cannot come in today. Pt would like an appt on Friday Sept 27 if she can be worked in.(that work work best for her)

## 2018-04-28 NOTE — Telephone Encounter (Signed)
Does she have enough until follow up next week?

## 2018-04-29 ENCOUNTER — Other Ambulatory Visit: Payer: Self-pay | Admitting: Podiatry

## 2018-04-30 NOTE — Telephone Encounter (Signed)
Spoke with patient and she has enough Wellbutrin until her follow up with Irving BurtonEmily.

## 2018-05-04 ENCOUNTER — Ambulatory Visit: Payer: Commercial Managed Care - PPO | Admitting: Family Medicine

## 2018-05-04 ENCOUNTER — Encounter: Payer: Self-pay | Admitting: Family Medicine

## 2018-05-04 VITALS — BP 112/68 | HR 72 | Temp 98.1°F | Resp 16 | Ht 68.0 in | Wt 192.4 lb

## 2018-05-04 DIAGNOSIS — M2550 Pain in unspecified joint: Secondary | ICD-10-CM

## 2018-05-04 DIAGNOSIS — J3089 Other allergic rhinitis: Secondary | ICD-10-CM

## 2018-05-04 DIAGNOSIS — E538 Deficiency of other specified B group vitamins: Secondary | ICD-10-CM

## 2018-05-04 DIAGNOSIS — E785 Hyperlipidemia, unspecified: Secondary | ICD-10-CM

## 2018-05-04 DIAGNOSIS — Z23 Encounter for immunization: Secondary | ICD-10-CM | POA: Diagnosis not present

## 2018-05-04 DIAGNOSIS — F325 Major depressive disorder, single episode, in full remission: Secondary | ICD-10-CM

## 2018-05-04 DIAGNOSIS — Z1231 Encounter for screening mammogram for malignant neoplasm of breast: Secondary | ICD-10-CM

## 2018-05-04 DIAGNOSIS — J302 Other seasonal allergic rhinitis: Secondary | ICD-10-CM

## 2018-05-04 DIAGNOSIS — J4521 Mild intermittent asthma with (acute) exacerbation: Secondary | ICD-10-CM

## 2018-05-04 DIAGNOSIS — E663 Overweight: Secondary | ICD-10-CM

## 2018-05-04 MED ORDER — BUPROPION HCL ER (XL) 150 MG PO TB24: 150.0000 mg | ORAL_TABLET | Freq: Every day | ORAL | 1 refills | Status: DC

## 2018-05-04 MED ORDER — FLUTICASONE FUROATE-VILANTEROL 100-25 MCG/INH IN AEPB: 1.0000 | INHALATION_SPRAY | Freq: Every day | RESPIRATORY_TRACT | 0 refills | Status: DC

## 2018-05-04 MED ORDER — FLUTICASONE PROPIONATE 50 MCG/ACT NA SUSP: 2.0000 | NASAL | 2 refills | Status: DC | PRN

## 2018-05-04 MED ORDER — ESCITALOPRAM OXALATE 10 MG PO TABS: 10.0000 mg | ORAL_TABLET | Freq: Every day | ORAL | 1 refills | Status: DC

## 2018-05-04 MED ORDER — ALBUTEROL SULFATE HFA 108 (90 BASE) MCG/ACT IN AERS: INHALATION_SPRAY | RESPIRATORY_TRACT | 0 refills | Status: DC

## 2018-05-04 NOTE — Progress Notes (Signed)
Name: Jill Sampson   MRN: [id number]    DOB: October 26, 1960   Date:05/04/2018       Progress Note  Subjective  Chief Complaint  Chief Complaint  Patient presents with  . Medication Refill    HPI   GERD with esophagitis: she went to see Dr. Tiffany Kocher and had EGD, hiatal hernia. She denies heartburn or indigestion.Taking Nexium daily and no side effects.  Discussed risk of long-term PPI use.  Discussed risk of taking Meloxicam ongoing.  AsthmaModerate: Daily dry cough has resolved after starting Breo daily; states seems to be triggered by allergies - will restart Xyzal today as well.  She has been using Ventolin once a week at most. No wheezing, SOB. PNA vaccines are UTD, flu shot today  Major Depression in remission  she has a long history of depression. She is taking medication daily, no side effects, good mood, no crying spells and is in remission at this time.  Has tried to wean off of lexapro in the past, but did not do well coming off of it.  HRT - she has been taking 0.72m estrace once daily at night and this works well for her.  She is s/p hysterectomy, taking estrace for hot flashes.  She would like to switch this RX to Dr. SAncil Boozer- will inquire about this for her.  She is also due for mammogram.  Hyperlipidemia: she is on life style modification only , last LDL was 110 , no chest pain or shortness of breath.  Joint Aches - she has been taking meloxicam daily - taking Rx from Dr. HMilinda Pointerfor her foot.  We will check CMP today.  She notes ongoing joint aches in her knees, left foot, BUE, ankles, and hips.  She endorses morning joint stiffness.  We will check some labs today.  B12 deficiency:  We will also check levels today.  Has not had injection in 6 months.  She endorses occasional fatigue  Obesity: she has lost 12 lbs since March 2019, she has been doing a lot of walking at [employer] she wants to work on improving her diet.  URI: Over the last few days she has had  increased sinus congestion, right ear fullness;  Denies fevers/chills, sinus pain, chest pain, shortness of breath, fevers/chills. Taking flonase, needs refill.  Not taking Xyzal, but will restart.   Patient Active Problem List   Diagnosis Date Noted  . History of kidney stones 04/12/2016  . Gastritis 10/08/2015  . Hiatal hernia 05/07/2015  . Arthralgia 05/07/2015  . Insomnia, persistent 03/21/2015  . Chronic LBP 03/21/2015  . Dyslipidemia 03/21/2015  . GERD without esophagitis 03/21/2015  . H/O: hysterectomy 03/21/2015  . H/O Malignant melanoma 03/21/2015  . Asthma, moderate persistent, poorly-controlled 03/21/2015  . Plantar fasciitis 03/21/2015  . B12 deficiency 03/21/2015  . Snores 03/21/2015  . Perennial allergic rhinitis with seasonal variation 03/21/2015  . Hyperlipidemia 03/21/2015    Past Surgical History:  Procedure Laterality Date  . ABDOMINAL HYSTERECTOMY    . CHOLECYSTECTOMY    . FUNCTIONAL ENDOSCOPIC SINUS SURGERY Bilateral   . PLANTAR FASCIA RELEASE Left 06/23/2017   Dr. HMilinda Pointer . REFRACTIVE SURGERY  2007  . SHOULDER SURGERY Left     Family History  Problem Relation Age of Onset  . Alzheimer's disease Mother   . Hypertension Mother   . Stroke Mother   . Migraines Mother   . Heart attack Father   . Heart disease Father   . Cancer  Sister        Breast  . Stroke Maternal Aunt   . Diabetes Maternal Uncle   . Kidney cancer Neg Hx   . Prostate cancer Neg Hx     Social History   Socioeconomic History  . Marital status: Single    Spouse name: Not on file  . Number of children: Not on file  . Years of education: Not on file  . Highest education level: Not on file  Occupational History    Employer: [employer]    Comment: contracted since retirement   Social Needs  . Financial resource strain: Not on file  . Food insecurity:    Worry: Not on file    Inability: Not on file  . Transportation needs:    Medical: Not on file    Non-medical: Not on file   Tobacco Use  . Smoking status: Never Smoker  . Smokeless tobacco: Never Used  Substance and Sexual Activity  . Alcohol use: No    Alcohol/week: 0.0 standard drinks  . Drug use: No  . Sexual activity: Not Currently  Lifestyle  . Physical activity:    Days per week: Not on file    Minutes per session: Not on file  . Stress: Not on file  Relationships  . Social connections:    Talks on phone: Not on file    Gets together: Not on file    Attends religious service: Not on file    Active member of club or organization: Not on file    Attends meetings of clubs or organizations: Not on file    Relationship status: Not on file  . Intimate partner violence:    Fear of current or ex partner: Not on file    Emotionally abused: Not on file    Physically abused: Not on file    Forced sexual activity: Not on file  Other Topics Concern  . 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  albuterol (PROVENTIL HFA;VENTOLIN HFA) 108 (90 Base) MCG/ACT inhaler, TAKE 2 PUFFS BY MOUTH EVERY 6 HOURS AS NEEDED FOR WHEEZE OR SHORTNESS OF BREATH, Disp: 8.5 Inhaler, Rfl: 0 .  albuterol (PROVENTIL) (2.5 MG/3ML) 0.083% nebulizer solution, Take 3 mLs (2.5 mg total) by nebulization every 6 (six) hours as needed for wheezing or shortness of breath. Do not use with HFA, Disp: 75 mL, Rfl: 12 .  buPROPion (WELLBUTRIN XL) 150 MG 24 hr tablet, TAKE 1 TABLET BY MOUTH EVERY DAY, Disp: 30 tablet, Rfl: 0 .  escitalopram (LEXAPRO) 10 MG tablet, TAKE 1 TABLET BY MOUTH EVERY DAY, Disp: 30 tablet, Rfl: 0 .  esomeprazole (NEXIUM) 40 MG capsule, TAKE ONE CAPSULE BY MOUTH DAILY AT NOON, Disp: 90 capsule, Rfl: 1 .  estradiol (ESTRACE) 1 MG tablet, TAKE 1 TABLET BY MOUTH EVERY DAY, Disp: 90 tablet, Rfl: 0 .  fluticasone (FLONASE) 50 MCG/ACT nasal spray, Place 2 sprays into both nostrils as needed., Disp: 16 g, Rfl: 2 .   fluticasone furoate-vilanterol (BREO ELLIPTA) 100-25 MCG/INH AEPB, Inhale 1 puff into the lungs daily., Disp: 60 each, Rfl: 0 .  meloxicam (MOBIC) 15 MG tablet, TAKE 1 TABLET BY MOUTH EVERY DAY, Disp: 30 tablet, Rfl: 3 .  azithromycin (ZITHROMAX) 250 MG tablet, 2 tablets on day 1, then 1 tablet daily on days 2-5. (Patient not taking: Reported on 05/04/2018),  Disp: 6 tablet, Rfl: 0 .  chlorpheniramine-HYDROcodone (TUSSIONEX PENNKINETIC ER) 10-8 MG/5ML SUER, Take 5 mLs by mouth every 12 (twelve) hours as needed. (Patient not taking: Reported on 05/04/2018), Disp: 115 mL, Rfl: 0 .  levocetirizine (XYZAL) 5 MG tablet, Take 1 tablet (5 mg total) by mouth every evening. (Patient not taking: Reported on 05/04/2018), Disp: 90 tablet, Rfl: 1 .  Olopatadine HCl (PAZEO) 0.7 % SOLN, , Disp: , Rfl:  .  predniSONE (DELTASONE) 10 MG tablet, Take 1 tablet (10 mg total) by mouth 2 (two) times daily with a meal. (Patient not taking: Reported on 05/04/2018), Disp: 10 tablet, Rfl: 0 .  Respiratory Therapy Supplies (NEBULIZER COMPRESSOR) KIT, 1 each by Does not apply route daily. (Patient not taking: Reported on 05/04/2018), Disp: 1 each, Rfl: 0 .  Respiratory Therapy Supplies (NEBULIZER/ADULT MASK) KIT, 1 each by Does not apply route daily. (Patient not taking: Reported on 05/04/2018), Disp: 1 each, Rfl: 2  Allergies  Allergen Reactions  . Amoxicillin Rash  . Clindamycin/Lincomycin Rash  . Doxycycline Rash  . Levofloxacin Rash    rash rash  . Penicillins Rash    I personally reviewed active problem list, medication list, allergies, health maintenance, notes from last encounter, lab results with the patient/caregiver today.   ROS  Constitutional: Negative for fever or weight change.  Respiratory: Negative for cough and shortness of breath.   Cardiovascular: Negative for chest pain or palpitations.  Gastrointestinal: Negative for abdominal pain, no bowel changes.  Musculoskeletal: Negative for gait problem or  joint swelling. See HPI regarding arthralgias Skin: Negative for rash.  Neurological: Negative for dizziness or headache.  No other specific complaints in a complete review of systems (except as listed in HPI above).  Objective  Vitals:   05/04/18 1029  BP: 112/68  Pulse: 72  Resp: 16  Temp: 98.1 F (36.7 C)  TempSrc: Oral  SpO2: 99%  Weight: 192 lb 6.4 oz (87.3 kg)  Height: 5' 8"  (1.727 m)   Body mass index is 29.25 kg/m.  Physical Exam  Constitutional: Patient appears well-developed and well-nourished. No distress.  HENT: Head: Normocephalic and atraumatic. Ears: bilateral TMs with no erythema or effusion; Nose: Nose normal. Mouth/Throat: Oropharynx is clear and moist. No oropharyngeal exudate or tonsillar swelling.  Eyes: Conjunctivae and EOM are normal. No scleral icterus.  Pupils are equal, round, and reactive to light.  Neck: Normal range of motion. Neck supple. No JVD present. No thyromegaly present.  Cardiovascular: Normal rate, regular rhythm and normal heart sounds.  No murmur heard. No BLE edema. Pulmonary/Chest: Effort normal and breath sounds normal. No respiratory distress. Abdominal: Soft. Bowel sounds are normal, no distension. There is no tenderness. No masses. Musculoskeletal: Normal range of motion, no joint effusions. No gross deformities Neurological: Pt is alert and oriented to person, place, and time. No cranial nerve deficit. Coordination, balance, strength, speech and gait are normal.  Skin: Skin is warm and dry. No rash noted. No erythema.  Psychiatric: Patient has a normal mood and affect. behavior is normal. Judgment and thought content normal.  No results found for this or any previous visit (from the past 72 hour(s)).  PHQ2/9: Depression screen [facility] 2/9 05/04/2018 11/01/2017 07/14/2017 04/13/2017 11/18/2016  Decreased Interest 0 0 0 0 0  Down, Depressed, Hopeless 0 0 0 0 0  PHQ - 2 Score 0 0 0 0 0  Altered sleeping 0 0 0 - -  Tired, decreased energy 0  0 0 - -  Change  in appetite 0 0 0 - -  Feeling bad or failure about yourself  0 0 0 - -  Trouble concentrating 0 0 0 - -  Moving slowly or fidgety/restless 0 0 0 - -  Suicidal thoughts 0 0 0 - -  PHQ-9 Score 0 0 0 - -  Difficult doing work/chores Not difficult at all - Not difficult at all - -   Fall Risk: Fall Risk  05/04/2018 11/01/2017 07/14/2017 04/13/2017 11/18/2016  Falls in the past year? No No No No No   Assessment & Plan  1. Allergic asthma, mild intermittent, with acute exacerbation - She feels like a new exacerbation may be starting at this time - lungs are CTA, and vitals are stable.  Advised to use medications as prescribed, drink plenty of fluids, get plenty of rest, and RTC PRN. - fluticasone furoate-vilanterol (BREO ELLIPTA) 100-25 MCG/INH AEPB; Inhale 1 puff into the lungs daily.  Dispense: 60 each; Refill: 0 - albuterol (PROVENTIL HFA;VENTOLIN HFA) 108 (90 Base) MCG/ACT inhaler; TAKE 2 PUFFS BY MOUTH EVERY 6 HOURS AS NEEDED FOR WHEEZE OR SHORTNESS OF BREATH  Dispense: 8.5 Inhaler; Refill: 0  2. Perennial allergic rhinitis with seasonal variation - fluticasone (FLONASE) 50 MCG/ACT nasal spray; Place 2 sprays into both nostrils as needed.  Dispense: 16 g; Refill: 2  3. Need for influenza vaccination - Flu Vaccine QUAD 6+ mos PF IM (Fluarix Quad PF)  4. Major depression in remission ([place]) - escitalopram (LEXAPRO) 10 MG tablet; Take 1 tablet (10 mg total) by mouth daily.  Dispense: 90 tablet; Refill: 1 - buPROPion (WELLBUTRIN XL) 150 MG 24 hr tablet; Take 1 tablet (150 mg total) by mouth daily.  Dispense: 90 tablet; Refill: 1  5. Breast cancer screening by mammogram - MM 3D SCREEN BREAST BILATERAL; Future  6. B12 deficiency - Vitamin B12 - CBC  7. Arthralgia, unspecified joint - Rheumatoid Factor - Sed Rate (ESR) - CBC  8. Hyperlipidemia, unspecified hyperlipidemia type Stable, lifestyle modifications only at this time.  9. Overweight (BMI 25.0-29.9) Discussed  importance of 150 minutes of physical activity weekly, eat two servings of fish weekly, eat one serving of tree nuts ( cashews, pistachios, pecans, almonds.Marland Kitchen) every other day, eat 6 servings of fruit/vegetables daily and drink plenty of water and avoid sweet beverages.   - COMPLETE METABOLIC PANEL WITH GFR - TSH

## 2018-05-07 ENCOUNTER — Other Ambulatory Visit: Payer: Self-pay | Admitting: Family Medicine

## 2018-05-07 DIAGNOSIS — M2550 Pain in unspecified joint: Secondary | ICD-10-CM

## 2018-05-10 LAB — TEST AUTHORIZATION

## 2018-05-10 LAB — COMPLETE METABOLIC PANEL WITH GFR
AG RATIO: 1.8 (calc) (ref 1.0–2.5)
ALBUMIN MSPROF: 4.2 g/dL (ref 3.6–5.1)
ALKALINE PHOSPHATASE (APISO): 89 U/L (ref 33–130)
ALT: 28 U/L (ref 6–29)
AST: 24 U/L (ref 10–35)
BUN: 19 mg/dL (ref 7–25)
CALCIUM: 9.3 mg/dL (ref 8.6–10.4)
CO2: 28 mmol/L (ref 20–32)
CREATININE: 0.89 mg/dL (ref 0.50–1.05)
Chloride: 109 mmol/L (ref 98–110)
GFR, EST NON AFRICAN AMERICAN: 72 mL/min/{1.73_m2} (ref >=60)
GFR, Est African American: 84 mL/min/{1.73_m2} (ref >=60)
GLOBULIN: 2.3 g/dL (ref 1.9–3.7)
Glucose, Bld: 77 mg/dL (ref 65–139)
POTASSIUM: 4.2 mmol/L (ref 3.5–5.3)
Sodium: 143 mmol/L (ref 135–146)
TOTAL PROTEIN: 6.5 g/dL (ref 6.1–8.1)
Total Bilirubin: 0.4 mg/dL (ref 0.2–1.2)

## 2018-05-10 LAB — TSH: TSH: 1.31 m[IU]/L (ref 0.40–4.50)

## 2018-05-10 LAB — CBC
HCT: 38.5 % (ref 35.0–45.0)
HEMOGLOBIN: 13.2 g/dL (ref 11.7–15.5)
MCH: 30.1 pg (ref 27.0–33.0)
MCHC: 34.3 g/dL (ref 32.0–36.0)
MCV: 87.7 fL (ref 80.0–100.0)
MPV: 10.2 fL (ref 7.5–12.5)
PLATELETS: 233 10*3/uL (ref 140–400)
RBC: 4.39 10*6/uL (ref 3.80–5.10)
RDW: 12.8 % (ref 11.0–15.0)
WBC: 6.5 10*3/uL (ref 3.8–10.8)

## 2018-05-10 LAB — ANA, IFA COMPREHENSIVE PANEL
ANA: NEGATIVE
ENA SM AB SER-ACNC: NEGATIVE AI
SCLERODERMA (SCL-70) (ENA) ANTIBODY, IGG: NEGATIVE AI
SM/RNP: <1 AI
SSA (Ro) (ENA) Antibody, IgG: <1 AI
SSB (LA) (ENA) ANTIBODY, IGG: NEGATIVE AI
ds DNA Ab: <1 IU/mL

## 2018-05-10 LAB — VITAMIN B12: VITAMIN B 12: 499 pg/mL (ref 200–1100)

## 2018-05-10 LAB — RHEUMATOID FACTOR

## 2018-05-10 LAB — SEDIMENTATION RATE: SED RATE: 14 mm/h (ref 0–30)

## 2018-07-11 ENCOUNTER — Other Ambulatory Visit: Payer: Self-pay | Admitting: Family Medicine

## 2018-07-11 DIAGNOSIS — K219 Gastro-esophageal reflux disease without esophagitis: Secondary | ICD-10-CM

## 2018-07-14 ENCOUNTER — Other Ambulatory Visit: Payer: Self-pay | Admitting: Certified Nurse Midwife

## 2018-08-06 ENCOUNTER — Ambulatory Visit: Payer: Commercial Managed Care - PPO | Admitting: Family Medicine

## 2018-08-17 NOTE — Telephone Encounter (Signed)
Left detailed msg on Pharmacy's vm.

## 2018-09-19 ENCOUNTER — Encounter: Payer: Self-pay | Admitting: Family Medicine

## 2018-09-19 ENCOUNTER — Ambulatory Visit: Payer: Commercial Managed Care - PPO | Admitting: Family Medicine

## 2018-09-19 VITALS — BP 110/70 | HR 75 | Temp 98.0°F | Resp 16 | Ht 68.0 in | Wt 198.3 lb

## 2018-09-19 DIAGNOSIS — K219 Gastro-esophageal reflux disease without esophagitis: Secondary | ICD-10-CM | POA: Diagnosis not present

## 2018-09-19 DIAGNOSIS — J4521 Mild intermittent asthma with (acute) exacerbation: Secondary | ICD-10-CM | POA: Diagnosis not present

## 2018-09-19 DIAGNOSIS — E785 Hyperlipidemia, unspecified: Secondary | ICD-10-CM

## 2018-09-19 DIAGNOSIS — E538 Deficiency of other specified B group vitamins: Secondary | ICD-10-CM

## 2018-09-19 DIAGNOSIS — F325 Major depressive disorder, single episode, in full remission: Secondary | ICD-10-CM

## 2018-09-19 DIAGNOSIS — J3089 Other allergic rhinitis: Secondary | ICD-10-CM

## 2018-09-19 DIAGNOSIS — R0989 Other specified symptoms and signs involving the circulatory and respiratory systems: Secondary | ICD-10-CM

## 2018-09-19 DIAGNOSIS — J302 Other seasonal allergic rhinitis: Secondary | ICD-10-CM

## 2018-09-19 DIAGNOSIS — M2550 Pain in unspecified joint: Secondary | ICD-10-CM

## 2018-09-19 DIAGNOSIS — J4540 Moderate persistent asthma, uncomplicated: Secondary | ICD-10-CM

## 2018-09-19 MED ORDER — LEVOCETIRIZINE DIHYDROCHLORIDE 5 MG PO TABS: 5.0000 mg | ORAL_TABLET | Freq: Every evening | ORAL | 1 refills | Status: DC

## 2018-09-19 MED ORDER — MELOXICAM 15 MG PO TABS: 15.0000 mg | ORAL_TABLET | Freq: Every day | ORAL | 2 refills | Status: DC

## 2018-09-19 MED ORDER — ESCITALOPRAM OXALATE 10 MG PO TABS: 10.0000 mg | ORAL_TABLET | Freq: Every day | ORAL | 1 refills | Status: DC

## 2018-09-19 MED ORDER — FLUTICASONE FUROATE-VILANTEROL 100-25 MCG/INH IN AEPB: 1.0000 | INHALATION_SPRAY | Freq: Every day | RESPIRATORY_TRACT | 2 refills | Status: DC

## 2018-09-19 MED ORDER — BUPROPION HCL ER (XL) 150 MG PO TB24: 150.0000 mg | ORAL_TABLET | Freq: Every day | ORAL | 1 refills | Status: DC

## 2018-09-19 MED ORDER — FLUTICASONE PROPIONATE 50 MCG/ACT NA SUSP: 2.0000 | NASAL | 2 refills | Status: AC | PRN

## 2018-09-19 MED ORDER — ESOMEPRAZOLE MAGNESIUM 40 MG PO CPDR: 40.0000 mg | DELAYED_RELEASE_CAPSULE | ORAL | 1 refills | Status: DC

## 2018-09-19 MED ORDER — ALBUTEROL SULFATE HFA 108 (90 BASE) MCG/ACT IN AERS: INHALATION_SPRAY | RESPIRATORY_TRACT | 3 refills | Status: AC

## 2018-09-19 MED ORDER — ALBUTEROL SULFATE (2.5 MG/3ML) 0.083% IN NEBU: 2.5000 mg | INHALATION_SOLUTION | Freq: Four times a day (QID) | RESPIRATORY_TRACT | 12 refills | Status: DC | PRN

## 2018-09-19 MED ORDER — PREDNISONE 10 MG PO TABS
10.0000 mg | ORAL_TABLET | Freq: Every day | ORAL | 0 refills | Status: DC
Start: 2018-09-19 — End: 2018-10-16

## 2018-09-19 NOTE — Progress Notes (Signed)
Name: Jill Sampson   MRN: [id number]    DOB: 1961/02/13   Date:09/19/2018       Progress Note  Subjective  Chief Complaint  Chief Complaint  Patient presents with  . throat problem    continues to have issues with her throat. She has some crackling x 1 week and continues to have wheezing. Last night symptoms worse than they have ever been. Treatment includes inhalers and nebulizer.    HPI   GERD with esophagitis: she went to see Dr. Tiffany Kocher and had EGD, hiatal hernia. She denies heartburn or indigestion.Taking Nexium daily to control symptoms, discussed long term risk of medication - colitis, heart disease and osteoporosis  Asthmawith exacerbation : she has been compliant with Breo and was doing well. Had on flare 04/2018, however over the past week, had some URI symptoms followed by a lot of sinus congestion and post-nasal drainage, also having right ear fullness. She has been coughing more, wheezing and SOB, also noticed some gurgling/crackling sound on her throat intermittently over the past few days. She has seen Dr. Ladene Artist in the past and we will send her back, we will also treat her with prednisone today, no stridor.   Major Depression in remission  she has a long history of depression. She is taking medication daily, no side effects, good mood, no crying spells and is in remission at this time She wakes up at night but able to fall right back asleep   Hyperlipidemia: she is on life style modification only , last LDL was 110 , no chest pain.   B12 deficiency: last level was normal, may take otc B12 prn   Obesity: she is losing weight again, has a part time job [employer] and may go full time soon, joints are okay, continue life style modification    Patient Active Problem List   Diagnosis Date Noted  . Overweight (BMI 25.0-29.9) 05/04/2018  . History of kidney stones 04/12/2016  . Gastritis 10/08/2015  . Hiatal hernia 05/07/2015  . Arthralgia  05/07/2015  . Insomnia, persistent 03/21/2015  . Chronic LBP 03/21/2015  . Dyslipidemia 03/21/2015  . GERD without esophagitis 03/21/2015  . H/O: hysterectomy 03/21/2015  . H/O Malignant melanoma 03/21/2015  . Asthma, moderate persistent, poorly-controlled 03/21/2015  . Plantar fasciitis 03/21/2015  . B12 deficiency 03/21/2015  . Snores 03/21/2015  . Perennial allergic rhinitis with seasonal variation 03/21/2015  . Hyperlipidemia 03/21/2015    Past Surgical History:  Procedure Laterality Date  . ABDOMINAL HYSTERECTOMY    . CHOLECYSTECTOMY    . FUNCTIONAL ENDOSCOPIC SINUS SURGERY Bilateral   . PLANTAR FASCIA RELEASE Left 06/23/2017   Dr. Milinda Pointer  . REFRACTIVE SURGERY  2007  . SHOULDER SURGERY Left     Family History  Problem Relation Age of Onset  . Alzheimer's disease Mother   . Hypertension Mother   . Stroke Mother   . Migraines Mother   . Heart attack Father   . Heart disease Father   . Cancer Sister        Breast  . Stroke Maternal Aunt   . Diabetes Maternal Uncle   . Kidney cancer Neg Hx   . Prostate cancer Neg Hx     Social History   Socioeconomic History  . Marital status: Single    Spouse name: Not on file  . Number of children: Not on file  . Years of education: Not on file  . Highest education level: Not on file  Occupational History  Employer: [employer]    Comment: contracted since retirement   Social Needs  . Financial resource strain: Not on file  . Food insecurity:    Worry: Not on file    Inability: Not on file  . Transportation needs:    Medical: Not on file    Non-medical: Not on file  Tobacco Use  . Smoking status: Never Smoker  . Smokeless tobacco: Never Used  Substance and Sexual Activity  . Alcohol use: No    Alcohol/week: 0.0 standard drinks  . Drug use: No  . Sexual activity: Not Currently  Lifestyle  . Physical activity:    Days per week: Not on file    Minutes per session: Not on file  . Stress: Not on file  Relationships   . Social connections:    Talks on phone: Not on file    Gets together: Not on file    Attends religious service: Not on file    Active member of club or organization: Not on file    Attends meetings of clubs or organizations: Not on file    Relationship status: Not on file  . Intimate partner violence:    Fear of current or ex partner: Not on file    Emotionally abused: Not on file    Physically abused: Not on file    Forced sexual activity: Not on file  Other Topics Concern  . 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  albuterol (PROVENTIL HFA;VENTOLIN HFA) 108 (90 Base) MCG/ACT inhaler, TAKE 2 PUFFS BY MOUTH EVERY 6 HOURS AS NEEDED FOR WHEEZE OR SHORTNESS OF BREATH, Disp: 8.5 Inhaler, Rfl: 3 .  albuterol (PROVENTIL) (2.5 MG/3ML) 0.083% nebulizer solution, Take 3 mLs (2.5 mg total) by nebulization every 6 (six) hours as needed for wheezing or shortness of breath. Do not use with HFA, Disp: 75 mL, Rfl: 12 .  buPROPion (WELLBUTRIN XL) 150 MG 24 hr tablet, Take 1 tablet (150 mg total) by mouth daily., Disp: 90 tablet, Rfl: 1 .  escitalopram (LEXAPRO) 10 MG tablet, Take 1 tablet (10 mg total) by mouth daily., Disp: 90 tablet, Rfl: 1 .  esomeprazole (NEXIUM) 40 MG capsule, Take 1 capsule (40 mg total) by mouth every morning., Disp: 90 capsule, Rfl: 1 .  estradiol (ESTRACE) 1 MG tablet, TAKE 1 TABLET BY MOUTH EVERY DAY, Disp: 90 tablet, Rfl: 0 .  fluticasone (FLONASE) 50 MCG/ACT nasal spray, Place 2 sprays into both nostrils as needed., Disp: 16 g, Rfl: 2 .  fluticasone furoate-vilanterol (BREO ELLIPTA) 100-25 MCG/INH AEPB, Inhale 1 puff into the lungs daily., Disp: 60 each, Rfl: 2 .  levocetirizine (XYZAL) 5 MG tablet, Take 1 tablet (5 mg total) by mouth every evening., Disp: 90 tablet, Rfl: 1 .  meloxicam (MOBIC) 15 MG tablet, Take 1 tablet (15 mg total) by mouth daily., Disp:  30 tablet, Rfl: 2 .  Olopatadine HCl (PAZEO) 0.7 % SOLN, , Disp: , Rfl:  .  Respiratory Therapy Supplies (NEBULIZER COMPRESSOR) KIT, 1 each by Does not apply route daily., Disp: 1 each, Rfl: 0 .  Respiratory Therapy Supplies (NEBULIZER/ADULT MASK) KIT, 1 each by Does not apply route daily., Disp: 1 each, Rfl: 2  Allergies  Allergen Reactions  . Amoxicillin Rash  . Clindamycin/Lincomycin Rash  . Doxycycline Rash  . Levofloxacin Rash    rash rash  .  Penicillins Rash    I personally reviewed active problem list, medication list, allergies, family history, social history with the patient/caregiver today.   ROS  Constitutional: Negative for fever or weight change.  Respiratory: positive  for cough and shortness of breath.   Cardiovascular: Negative for chest pain or palpitations.  Gastrointestinal: Negative for abdominal pain, no bowel changes.  Musculoskeletal: Negative for gait problem or joint swelling.  Skin: Negative for rash.  Neurological: Negative for dizziness or headache.  No other specific complaints in a complete review of systems (except as listed in HPI above).  Objective  Vitals:   09/19/18 1140  BP: 110/70  Pulse: 75  Resp: 16  Temp: 98 F (36.7 C)  TempSrc: Oral  SpO2: 96%  Weight: 198 lb 4.8 oz (89.9 kg)  Height: 5' 8" (1.727 m)    Body mass index is 30.15 kg/m.  Physical Exam  Constitutional: Patient appears well-developed and well-nourished. Obese  No distress.  HEENT: head atraumatic, normocephalic, pupils equal and reactive to light, ears normal TM bilateral , neck supple, throat within normal limits, tender during percussion of both maxillary sinus  Cardiovascular: Normal rate, regular rhythm and normal heart sounds.  No murmur heard. No BLE edema. Pulmonary/Chest: Effort normal and breath sounds normal. No respiratory distress. Abdominal: Soft.  There is no tenderness. Psychiatric: Patient has a normal mood and affect. behavior is normal.  Judgment and thought content normal.  PHQ2/9: Depression screen PHQ 2/9 09/19/2018 09/19/2018 05/04/2018 11/01/2017 07/14/2017  Decreased Interest 0 0 0 0 0  Down, Depressed, Hopeless 0 0 0 0 0  PHQ - 2 Score 0 0 0 0 0  Altered sleeping 3 - 0 0 0  Tired, decreased energy 0 - 0 0 0  Change in appetite 0 - 0 0 0  Feeling bad or failure about yourself  0 - 0 0 0  Trouble concentrating 0 - 0 0 0  Moving slowly or fidgety/restless 0 - 0 0 0  Suicidal thoughts 0 - 0 0 0  PHQ-9 Score 3 - 0 0 0  Difficult doing work/chores Not difficult at all - Not difficult at all - Not difficult at all    Fall Risk: Fall Risk  09/19/2018 05/04/2018 11/01/2017 07/14/2017 04/13/2017  Falls in the past year? 0 No No No No  Number falls in past yr: 0 - - - -  Injury with Fall? 0 - - - -     Assessment & Plan  1. Allergic asthma, mild intermittent, with acute exacerbation  - levocetirizine (XYZAL) 5 MG tablet; Take 1 tablet (5 mg total) by mouth every evening.  Dispense: 90 tablet; Refill: 1 - fluticasone furoate-vilanterol (BREO ELLIPTA) 100-25 MCG/INH AEPB; Inhale 1 puff into the lungs daily.  Dispense: 60 each; Refill: 2 - albuterol (PROVENTIL HFA;VENTOLIN HFA) 108 (90 Base) MCG/ACT inhaler; TAKE 2 PUFFS BY MOUTH EVERY 6 HOURS AS NEEDED FOR WHEEZE OR SHORTNESS OF BREATH  Dispense: 8.5 Inhaler; Refill: 3 - albuterol (PROVENTIL) (2.5 MG/3ML) 0.083% nebulizer solution; Take 3 mLs (2.5 mg total) by nebulization every 6 (six) hours as needed for wheezing or shortness of breath. Do not use with HFA  Dispense: 75 mL; Refill: 12  2. Perennial allergic rhinitis with seasonal variation  - fluticasone (FLONASE) 50 MCG/ACT nasal spray; Place 2 sprays into both nostrils as needed.  Dispense: 16 g; Refill: 2  3. GERD without esophagitis  - esomeprazole (NEXIUM) 40 MG capsule; Take 1 capsule (40 mg total) by mouth every   morning.  Dispense: 90 capsule; Refill: 1  4. Major depression in remission (HCC)  - escitalopram  (LEXAPRO) 10 MG tablet; Take 1 tablet (10 mg total) by mouth daily.  Dispense: 90 tablet; Refill: 1 - buPROPion (WELLBUTRIN XL) 150 MG 24 hr tablet; Take 1 tablet (150 mg total) by mouth daily.  Dispense: 90 tablet; Refill: 1  5. Asthma, moderate persistent, poorly-controlled   6. B12 deficiency   7. Dyslipidemia   8. Arthralgia, unspecified joint  - meloxicam (MOBIC) 15 MG tablet; Take 1 tablet (15 mg total) by mouth daily.  Dispense: 30 tablet; Refill: 2  9. Gurgling breath sounds  - Ambulatory referral to ENT 

## 2018-10-11 ENCOUNTER — Other Ambulatory Visit: Payer: Self-pay | Admitting: Family Medicine

## 2018-10-11 NOTE — Telephone Encounter (Addendum)
Copied from CRM [id number]. Topic: Quick Communication - See Telephone Encounter >> Oct 11, 2018  4:14 PM [name] wrote: CRM for notification. See Telephone encounter for: 10/11/18. Patient would like for the provider to call her in some estradiol (ESTRACE) 1 MG tablet 1mg  into her preferred pharmacy [facility] on [address].

## 2018-10-12 NOTE — Telephone Encounter (Signed)
Refill request for general medication. Estrace to [facility]   Last office visit 09/19/2018   Follow up on 12/19/2018

## 2018-10-14 ENCOUNTER — Other Ambulatory Visit: Payer: Self-pay | Admitting: Family Medicine

## 2018-10-14 DIAGNOSIS — J4521 Mild intermittent asthma with (acute) exacerbation: Secondary | ICD-10-CM

## 2018-10-14 MED ORDER — ESTRADIOL 1 MG PO TABS: 1.0000 mg | ORAL_TABLET | Freq: Every day | ORAL | 0 refills | Status: AC

## 2018-10-16 ENCOUNTER — Ambulatory Visit
Admission: EM | Admit: 2018-10-16 | Discharge: 2018-10-16 | Disposition: A | Discharge status: Home / Self Care | Payer: Commercial Managed Care - PPO | Attending: Family Medicine | Admitting: Family Medicine

## 2018-10-16 ENCOUNTER — Encounter: Payer: Self-pay | Admitting: Emergency Medicine

## 2018-10-16 ENCOUNTER — Other Ambulatory Visit: Payer: Self-pay

## 2018-10-16 DIAGNOSIS — Z8709 Personal history of other diseases of the respiratory system: Secondary | ICD-10-CM | POA: Diagnosis not present

## 2018-10-16 DIAGNOSIS — J209 Acute bronchitis, unspecified: Secondary | ICD-10-CM | POA: Diagnosis not present

## 2018-10-16 MED ORDER — BENZONATATE 200 MG PO CAPS: ORAL_CAPSULE | ORAL | 0 refills | Status: DC

## 2018-10-16 MED ORDER — HYDROCOD POLST-CPM POLST ER 10-8 MG/5ML PO SUER: 5.0000 mL | Freq: Two times a day (BID) | ORAL | 0 refills | Status: DC

## 2018-10-16 MED ORDER — AZITHROMYCIN 500 MG PO TABS: 500.0000 mg | ORAL_TABLET | Freq: Every day | ORAL | 0 refills | Status: DC

## 2018-10-16 NOTE — ED Triage Notes (Signed)
Patient reports sinus pressure and congestion for the past 2 weeks.  Patient c/o cough and chest congestion for the past 2 days.  Patient denies recent fevers.

## 2018-10-16 NOTE — ED Provider Notes (Signed)
[facility]    CSN: [id number] Arrival date & time: 10/16/18  0919     History   Chief Complaint Chief Complaint  Patient presents with  . Cough    HPI Jill Sampson is a 58 y.o. female.   HPI  58 year old female presents with sinus pressure and congestion that she has had for over 2 weeks.  Also had cough and chest congestion for the past 2 days.  This is been worse.  She has a history of asthma and bronchitis that happens yearly.  She has inhalers at home that she is been using.  She does not smoke.         Past Medical History:  Diagnosis Date  . Allergy   . Anxiety   . Asthma   . Chronic insomnia   . Depression   . Dyslipidemia   . Esophagitis   . Gastritis   . GERD (gastroesophageal reflux disease)   . History of melanoma   . Low back pain   . Panic attack   . Snoring   . Vitamin B 12 deficiency     Patient Active Problem List   Diagnosis Date Noted  . Overweight (BMI 25.0-29.9) 05/04/2018  . History of kidney stones 04/12/2016  . Gastritis 10/08/2015  . Hiatal hernia 05/07/2015  . Arthralgia 05/07/2015  . Insomnia, persistent 03/21/2015  . Chronic LBP 03/21/2015  . Dyslipidemia 03/21/2015  . GERD without esophagitis 03/21/2015  . H/O: hysterectomy 03/21/2015  . H/O Malignant melanoma 03/21/2015  . Asthma, moderate persistent, poorly-controlled 03/21/2015  . Plantar fasciitis 03/21/2015  . B12 deficiency 03/21/2015  . Snores 03/21/2015  . Perennial allergic rhinitis with seasonal variation 03/21/2015  . Hyperlipidemia 03/21/2015    Past Surgical History:  Procedure Laterality Date  . ABDOMINAL HYSTERECTOMY    . CHOLECYSTECTOMY    . FUNCTIONAL ENDOSCOPIC SINUS SURGERY Bilateral   . PLANTAR FASCIA RELEASE Left 06/23/2017   Dr. Milinda Pointer  . REFRACTIVE SURGERY  2007  . SHOULDER SURGERY Left     OB History   No obstetric history on file.      Home Medications    Prior to Admission medications   Medication Sig Start  Date End Date Taking? Authorizing Provider  albuterol (PROVENTIL HFA;VENTOLIN HFA) 108 (90 Base) MCG/ACT inhaler TAKE 2 PUFFS BY MOUTH EVERY 6 HOURS AS NEEDED FOR WHEEZE OR SHORTNESS OF BREATH 09/19/18  Yes Sowles, Drue Stager, MD  BREO ELLIPTA 100-25 MCG/INH AEPB TAKE 1 PUFF BY MOUTH EVERY DAY 10/14/18  Yes Sowles, Drue Stager, MD  buPROPion (WELLBUTRIN XL) 150 MG 24 hr tablet Take 1 tablet (150 mg total) by mouth daily. 09/19/18  Yes Sowles, Drue Stager, MD  escitalopram (LEXAPRO) 10 MG tablet Take 1 tablet (10 mg total) by mouth daily. 09/19/18  Yes Sowles, Drue Stager, MD  esomeprazole (NEXIUM) 40 MG capsule Take 1 capsule (40 mg total) by mouth every morning. 09/19/18  Yes Sowles, Drue Stager, MD  estradiol (ESTRACE) 1 MG tablet Take 1 tablet (1 mg total) by mouth daily. 10/14/18  Yes Sowles, Drue Stager, MD  fluticasone (FLONASE) 50 MCG/ACT nasal spray Place 2 sprays into both nostrils as needed. 09/19/18  Yes Sowles, Drue Stager, MD  levocetirizine (XYZAL) 5 MG tablet Take 1 tablet (5 mg total) by mouth every evening. 09/19/18  Yes Sowles, Drue Stager, MD  meloxicam (MOBIC) 15 MG tablet Take 1 tablet (15 mg total) by mouth daily. 09/19/18  Yes Sowles, Drue Stager, MD  albuterol (PROVENTIL) (2.5 MG/3ML) 0.083% nebulizer solution Take 3 mLs (2.5  mg total) by nebulization every 6 (six) hours as needed for wheezing or shortness of breath. Do not use with [facility] 09/19/18   Steele Sizer, MD  azithromycin (ZITHROMAX) 500 MG tablet Take 1 tablet (500 mg total) by mouth daily. 10/16/18   Lorin Picket, PA-C  benzonatate (TESSALON) 200 MG capsule Take one cap TID PRN cough 10/16/18   Lorin Picket, PA-C  chlorpheniramine-HYDROcodone [facility] ER) 10-8 MG/5ML SUER Take 5 mLs by mouth 2 (two) times daily. 10/16/18   Lorin Picket, PA-C  Olopatadine HCl (PAZEO) 0.7 % SOLN  07/29/15   [provider]  Respiratory Therapy Supplies (NEBULIZER COMPRESSOR) KIT 1 each by Does not apply route daily. 11/01/17   Steele Sizer, MD    Respiratory Therapy Supplies (NEBULIZER/ADULT MASK) KIT 1 each by Does not apply route daily. 11/01/17   Steele Sizer, MD    Family History Family History  Problem Relation Age of Onset  . Alzheimer's disease Mother   . Hypertension Mother   . Stroke Mother   . Migraines Mother   . Heart attack Father   . Heart disease Father   . Cancer Sister        Breast  . Stroke Maternal Aunt   . Diabetes Maternal Uncle   . Kidney cancer Neg Hx   . Prostate cancer Neg Hx     Social History Social History   Tobacco Use  . Smoking status: Never Smoker  . Smokeless tobacco: Never Used  Substance Use Topics  . Alcohol use: No    Alcohol/week: 0.0 standard drinks  . Drug use: No     Allergies   Amoxicillin; Clindamycin/lincomycin; Doxycycline; Levofloxacin; and Penicillins   Review of Systems Review of Systems  Constitutional: Positive for activity change. Negative for chills, fatigue and fever.  HENT: Positive for congestion, sinus pressure and sinus pain.   Respiratory: Positive for cough, shortness of breath and wheezing.   All other systems reviewed and are negative.    Physical Exam Triage Vital Signs ED Triage Vitals  Enc Vitals Group     BP 10/16/18 0928 (!) 143/84     Pulse Rate 10/16/18 0928 62     Resp 10/16/18 0928 16     Temp 10/16/18 0928 97.7 F (36.5 C)     Temp Source 10/16/18 0928 Oral     SpO2 10/16/18 0928 98 %     Weight 10/16/18 0926 190 lb (86.2 kg)     Height 10/16/18 0926 5' 7"  (1.702 m)     Head Circumference --      Peak Flow --      Pain Score 10/16/18 0926 0     Pain Loc --      Pain Edu? --      Excl. in [place]? --    No data found.  Updated Vital Signs BP (!) 143/84 (BP Location: Left Arm)   Pulse 62   Temp 97.7 F (36.5 C) (Oral)   Resp 16   Ht 5' 7"  (1.702 m)   Wt 190 lb (86.2 kg)   SpO2 98%   BMI 29.76 kg/m   Visual Acuity Right Eye Distance:   Left Eye Distance:   Bilateral Distance:    Right Eye Near:   Left Eye  Near:    Bilateral Near:     Physical Exam Vitals signs and nursing note reviewed.  Constitutional:      General: She is not in acute distress.  Appearance: Normal appearance. She is not ill-appearing, toxic-appearing or diaphoretic.  HENT:     Head: Normocephalic and atraumatic.     Right Ear: Tympanic membrane, ear canal and external ear normal.     Left Ear: Tympanic membrane, ear canal and external ear normal.     Ears:     Comments: Both TMs are dull    Nose: Nose normal. No congestion or rhinorrhea.     Mouth/Throat:     Mouth: Mucous membranes are moist.     Pharynx: Oropharynx is clear. No oropharyngeal exudate or posterior oropharyngeal erythema.  Eyes:     General:        Right eye: No discharge.        Left eye: No discharge.     Conjunctiva/sclera: Conjunctivae normal.  Neck:     Musculoskeletal: Normal range of motion and neck supple.  Pulmonary:     Effort: Pulmonary effort is normal.     Breath sounds: Normal breath sounds.  Musculoskeletal: Normal range of motion.  Skin:    General: Skin is warm and dry.  Neurological:     General: No focal deficit present.     Mental Status: She is alert and oriented to person, place, and time.  Psychiatric:        Mood and Affect: Mood normal.        Behavior: Behavior normal.        Thought Content: Thought content normal.        Judgment: Judgment normal.      UC Treatments / Results  Labs (all labs ordered are listed, but only abnormal results are displayed) Labs Reviewed - No data to display  EKG None  Radiology No results found.  Procedures Procedures (including critical care time)  Medications Ordered in UC Medications - No data to display  Initial Impression / Assessment and Plan / UC Course  I have reviewed the triage vital signs and the nursing notes.  Pertinent labs & imaging results that were available during my care of the patient were reviewed by me and considered in my medical decision  making (see chart for details).   Patient has had symptoms for over 2 weeks with an exacerbation of cough and congestion recently.  Has a history of asthma and recurrent bronchitis.  We will provide her with azithromycin and Tussionex.  Also given her prescription for Tessalon Perles for daytime use.  She states that this is a normal routine that she takes which helps alleviate her problems.  If she worsens she should follow-up with her primary care physician   Final Clinical Impressions(s) / UC Diagnoses   Final diagnoses:  Acute bronchitis, unspecified organism   Discharge Instructions   None    ED Prescriptions    Medication Sig Dispense Auth. Provider   azithromycin (ZITHROMAX) 500 MG tablet Take 1 tablet (500 mg total) by mouth daily. 3 tablet Lorin Picket, PA-C   chlorpheniramine-HYDROcodone (TUSSIONEX PENNKINETIC ER) 10-8 MG/5ML SUER Take 5 mLs by mouth 2 (two) times daily. 115 mL Crecencio Mc P, PA-C   benzonatate (TESSALON) 200 MG capsule Take one cap TID PRN cough 30 capsule Lorin Picket, PA-C     Controlled Substance Prescriptions [place] Controlled Substance Registry consulted? Not Applicable   Lorin Picket, PA-C 10/16/18 1122

## 2018-10-26 ENCOUNTER — Ambulatory Visit: Payer: Commercial Managed Care - PPO | Admitting: Family Medicine

## 2018-11-08 ENCOUNTER — Other Ambulatory Visit: Payer: Self-pay | Admitting: Family Medicine

## 2018-11-08 DIAGNOSIS — J4521 Mild intermittent asthma with (acute) exacerbation: Secondary | ICD-10-CM

## 2018-12-19 ENCOUNTER — Ambulatory Visit (INDEPENDENT_AMBULATORY_CARE_PROVIDER_SITE_OTHER): Payer: Commercial Managed Care - PPO | Admitting: Family Medicine

## 2018-12-19 ENCOUNTER — Encounter: Payer: Self-pay | Admitting: Family Medicine

## 2018-12-19 DIAGNOSIS — E538 Deficiency of other specified B group vitamins: Secondary | ICD-10-CM

## 2018-12-19 DIAGNOSIS — M2550 Pain in unspecified joint: Secondary | ICD-10-CM | POA: Diagnosis not present

## 2018-12-19 DIAGNOSIS — F325 Major depressive disorder, single episode, in full remission: Secondary | ICD-10-CM | POA: Diagnosis not present

## 2018-12-19 DIAGNOSIS — K219 Gastro-esophageal reflux disease without esophagitis: Secondary | ICD-10-CM | POA: Diagnosis not present

## 2018-12-19 DIAGNOSIS — J4530 Mild persistent asthma, uncomplicated: Secondary | ICD-10-CM | POA: Diagnosis not present

## 2018-12-19 DIAGNOSIS — J3089 Other allergic rhinitis: Secondary | ICD-10-CM

## 2018-12-19 DIAGNOSIS — J302 Other seasonal allergic rhinitis: Secondary | ICD-10-CM

## 2018-12-19 DIAGNOSIS — E785 Hyperlipidemia, unspecified: Secondary | ICD-10-CM

## 2018-12-19 DIAGNOSIS — E669 Obesity, unspecified: Secondary | ICD-10-CM

## 2018-12-19 MED ORDER — MELOXICAM 7.5 MG PO TABS: 7.5000 mg | ORAL_TABLET | Freq: Two times a day (BID) | ORAL | 0 refills | Status: AC | PRN

## 2018-12-19 MED ORDER — BUPROPION HCL ER (XL) 150 MG PO TB24: 150.0000 mg | ORAL_TABLET | Freq: Every day | ORAL | 0 refills | Status: DC

## 2018-12-19 MED ORDER — FLUTICASONE FUROATE-VILANTEROL 100-25 MCG/INH IN AEPB: 1.0000 | INHALATION_SPRAY | Freq: Every day | RESPIRATORY_TRACT | 0 refills | Status: AC

## 2018-12-19 MED ORDER — ESOMEPRAZOLE MAGNESIUM 40 MG PO CPDR: 40.0000 mg | DELAYED_RELEASE_CAPSULE | ORAL | 0 refills | Status: DC

## 2018-12-19 MED ORDER — ESCITALOPRAM OXALATE 10 MG PO TABS: 10.0000 mg | ORAL_TABLET | Freq: Every day | ORAL | 0 refills | Status: AC

## 2018-12-19 MED ORDER — LEVOCETIRIZINE DIHYDROCHLORIDE 5 MG PO TABS: 5.0000 mg | ORAL_TABLET | Freq: Every evening | ORAL | 0 refills | Status: AC

## 2018-12-19 MED ORDER — ALBUTEROL SULFATE (2.5 MG/3ML) 0.083% IN NEBU: 2.5000 mg | INHALATION_SOLUTION | Freq: Four times a day (QID) | RESPIRATORY_TRACT | 0 refills | Status: AC | PRN

## 2018-12-19 NOTE — Progress Notes (Signed)
Name: Jill Sampson   MRN: [id number]    DOB: 07-11-1961   Date:12/19/2018       Progress Note  Subjective  Chief Complaint  Chief Complaint  Patient presents with  . Depression    I connected with  Barkley Bruns  on 12/19/18 at  3:40 PM EDT by a video enabled telemedicine application and verified that I am speaking with the correct person using two identifiers.  I discussed the limitations of evaluation and management by telemedicine and the availability of in person appointments. The patient expressed understanding and agreed to proceed. Staff also discussed with the patient that there may be a patient responsible charge related to this service. Patient Location: at work/ at a parking in lot  Provider Location: [facility]   HPI  GERD with esophagitis: she went to see Dr. Tiffany Kocher and had EGD, hiatal hernia.She denies heartburn or indigestion.Taking Nexium daily to control symptoms, discussed long term risk of medication - colitis, heart disease and osteoporosis. Unchanged  Asthmapersistent  : she has been compliant with Breo and was doing well. Last flare was March 2020 and went to Urgent Care took tussionex and Zpack and symptoms resolved.  She states symptoms are under control right now, no wheezing, cough or SOB   Major Depressionin remissionshe has a long history of depression. She is taking medication daily, no side effects, good mood, no crying spells and is in remission at this time She wakes up at night but able to fall right back asleep Unchanged   Hyperlipidemia: she is on life style modification only, last LDL was 110 , no chest pain. She is due for labs and we will recheck on her next visit   B12 deficiency: last level was normal, may take otc B12 prn . 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   Arthralgia: negative labs fall 2019, doing well on meloxicam daily but we will try going down to 7.5 mg to take one or two daily and monitor, explained nsaid's not good for GERD Patient Active Problem List   Diagnosis Date Noted  . Overweight (BMI 25.0-29.9) 05/04/2018  . History of kidney stones 04/12/2016  . Gastritis 10/08/2015  . Hiatal hernia 05/07/2015  . Arthralgia 05/07/2015  . Insomnia, persistent 03/21/2015  . Chronic LBP 03/21/2015  . Dyslipidemia 03/21/2015  . GERD without esophagitis 03/21/2015  . H/O: hysterectomy 03/21/2015  . H/O Malignant melanoma 03/21/2015  . Asthma, moderate persistent, poorly-controlled 03/21/2015  . Plantar fasciitis 03/21/2015  . B12 deficiency 03/21/2015  . Snores 03/21/2015  . Perennial allergic rhinitis with seasonal variation 03/21/2015  . Hyperlipidemia 03/21/2015    Past Surgical History:  Procedure Laterality Date  . ABDOMINAL HYSTERECTOMY    . CHOLECYSTECTOMY    . FUNCTIONAL ENDOSCOPIC SINUS SURGERY Bilateral   . PLANTAR FASCIA RELEASE Left 06/23/2017   Dr. Milinda Pointer  . REFRACTIVE SURGERY  2007  . SHOULDER SURGERY Left     Family History  Problem Relation Age of Onset  . Alzheimer's disease Mother   . Hypertension Mother   . Stroke Mother   . Migraines Mother   .  Heart attack Father   . Heart disease Father   . Cancer Sister        Breast  . Stroke Maternal Aunt   . Diabetes Maternal Uncle   . Kidney cancer Neg Hx   . Prostate cancer Neg Hx     Social History   Socioeconomic History  . Marital status: Single    Spouse name: Not on file  . Number of children: Not on file  . Years of education: Not on file  . Highest education level: Not on file  Occupational History    Employer: [employer]    Comment: contracted since retirement    Social Needs  . Financial resource strain: Not on file  . Food insecurity:    Worry: Not on file    Inability: Not on file  . Transportation needs:    Medical: Not on file    Non-medical: Not on file  Tobacco Use  . Smoking status: Never Smoker  . Smokeless tobacco: Never Used  Substance and Sexual Activity  . Alcohol use: No    Alcohol/week: 0.0 standard drinks  . Drug use: No  . Sexual activity: Not Currently  Lifestyle  . Physical activity:    Days per week: Not on file    Minutes per session: Not on file  . Stress: Not on file  Relationships  . Social connections:    Talks on phone: Not on file    Gets together: Not on file    Attends religious service: Not on file    Active member of club or organization: Not on file    Attends meetings of clubs or organizations: Not on file    Relationship status: Not on file  . Intimate partner violence:    Fear of current or ex partner: Not on file    Emotionally abused: Not on file    Physically abused: Not on file    Forced sexual activity: Not on file  Other Topics Concern  . 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  albuterol (PROVENTIL HFA;VENTOLIN HFA) 108 (90 Base) MCG/ACT inhaler, TAKE 2 PUFFS BY MOUTH EVERY 6 HOURS AS NEEDED FOR WHEEZE OR SHORTNESS OF BREATH, Disp: 8.5 Inhaler, Rfl: 3 .  albuterol (PROVENTIL) (2.5 MG/3ML) 0.083% nebulizer solution, Take 3 mLs (2.5 mg total) by nebulization every 6 (six) hours as needed for wheezing or shortness of breath. Do not use with HFA, Disp: 75 mL, Rfl: 12 .  BREO ELLIPTA 100-25 MCG/INH AEPB, INHALE 1 PUFF BY MOUTH EVERY DAY, Disp: 180 each, Rfl: 0 .  buPROPion (WELLBUTRIN XL) 150 MG 24 hr tablet, Take 1 tablet (150 mg total) by mouth daily., Disp: 90 tablet, Rfl: 1 .  escitalopram (LEXAPRO) 10 MG tablet, Take 1 tablet (10 mg total) by mouth daily., Disp: 90 tablet,  Rfl: 1 .  esomeprazole (NEXIUM) 40 MG capsule, Take 1 capsule (40 mg total) by mouth every morning., Disp: 90 capsule, Rfl: 1 .  fluticasone (FLONASE) 50 MCG/ACT nasal spray, Place 2 sprays into both nostrils as needed., Disp: 16 g, Rfl: 2 .  levocetirizine (XYZAL) 5 MG tablet, Take 1 tablet (5 mg total) by mouth every evening., Disp: 90 tablet, Rfl: 1 .  meloxicam (MOBIC) 15 MG tablet, Take 1 tablet (15 mg total) by mouth daily., Disp: 30 tablet, Rfl: 2 .  Olopatadine  HCl (PAZEO) 0.7 % SOLN, , Disp: , Rfl:  .  Respiratory Therapy Supplies (NEBULIZER COMPRESSOR) KIT, 1 each by Does not apply route daily., Disp: 1 each, Rfl: 0 .  Respiratory Therapy Supplies (NEBULIZER/ADULT MASK) KIT, 1 each by Does not apply route daily., Disp: 1 each, Rfl: 2 .  azithromycin (ZITHROMAX) 500 MG tablet, Take 1 tablet (500 mg total) by mouth daily. (Patient not taking: Reported on 12/19/2018), Disp: 3 tablet, Rfl: 0 .  benzonatate (TESSALON) 200 MG capsule, Take one cap TID PRN cough (Patient not taking: Reported on 12/19/2018), Disp: 30 capsule, Rfl: 0 .  chlorpheniramine-HYDROcodone (TUSSIONEX PENNKINETIC ER) 10-8 MG/5ML SUER, Take 5 mLs by mouth 2 (two) times daily. (Patient not taking: Reported on 12/19/2018), Disp: 115 mL, Rfl: 0 .  estradiol (ESTRACE) 1 MG tablet, Take 1 tablet (1 mg total) by mouth daily., Disp: 90 tablet, Rfl: 0  Allergies  Allergen Reactions  . Amoxicillin Rash  . Clindamycin/Lincomycin Rash  . Doxycycline Rash  . Levofloxacin Rash    rash rash  . Penicillins Rash    I personally reviewed active problem list, medication list, allergies, family history, social history with the patient/caregiver today.   ROS  Ten systems reviewed and is negative except as mentioned in HPI   Objective  Virtual encounter, vitals not obtained.  There is no height or weight on file to calculate BMI.  Physical Exam  Awake, alert and oriented, in no distress  PHQ2/9: Depression screen [facility] 2/9  12/19/2018 09/19/2018 09/19/2018 05/04/2018 11/01/2017  Decreased Interest 0 0 0 0 0  Down, Depressed, Hopeless 0 0 0 0 0  PHQ - 2 Score 0 0 0 0 0  Altered sleeping 0 3 - 0 0  Tired, decreased energy 0 0 - 0 0  Change in appetite 0 0 - 0 0  Feeling bad or failure about yourself  0 0 - 0 0  Trouble concentrating 0 0 - 0 0  Moving slowly or fidgety/restless 0 0 - 0 0  Suicidal thoughts 0 0 - 0 0  PHQ-9 Score 0 3 - 0 0  Difficult doing work/chores - Not difficult at all - Not difficult at all -   PHQ-2/9 Result is negative.    Fall Risk: Fall Risk  09/19/2018 05/04/2018 11/01/2017 07/14/2017 04/13/2017  Falls in the past year? 0 No No No No  Number falls in past yr: 0 - - - -  Injury with Fall? 0 - - - -     Assessment & Plan  1. Mild persistent asthma without complication  - albuterol (PROVENTIL) (2.5 MG/3ML) 0.083% nebulizer solution; Take 3 mLs (2.5 mg total) by nebulization every 6 (six) hours as needed for wheezing or shortness of breath. Do not use with HFA  Dispense: 75 mL; Refill: 0 - fluticasone furoate-vilanterol (BREO ELLIPTA) 100-25 MCG/INH AEPB; Inhale 1 puff into the lungs daily.  Dispense: 180 each; Refill: 0 - levocetirizine (XYZAL) 5 MG tablet; Take 1 tablet (5 mg total) by mouth every evening.  Dispense: 90 tablet; Refill: 0  2. Major depression in remission (HCC)  - buPROPion (WELLBUTRIN XL) 150 MG 24 hr tablet; Take 1 tablet (150 mg total) by mouth daily.  Dispense: 90 tablet; Refill: 0 - escitalopram (LEXAPRO) 10 MG tablet; Take 1 tablet (10 mg total) by mouth daily.  Dispense: 90 tablet; Refill: 0  3. GERD without esophagitis  - esomeprazole (NEXIUM) 40 MG capsule; Take 1 capsule (40 mg total) by mouth every morning.  Dispense: 90 capsule; Refill: 0  4. Arthralgia, unspecified joint  - meloxicam (MOBIC) 7.5 MG tablet; Take 1 tablet (7.5 mg total) by mouth 2 (two) times daily as needed for pain.  Dispense: 180 tablet; Refill: 0  5. B12 deficiency  Continue otc  supplementation   6. Dyslipidemia  Recheck next visit   7. Perennial allergic rhinitis with seasonal variation  - levocetirizine (XYZAL) 5 MG tablet; Take 1 tablet (5 mg total) by mouth every evening.  Dispense: 90 tablet; Refill: 0  8. Obesity (BMI 30.0-34.9)  Discussed with the patient the risk posed by an increased BMI. Discussed importance of portion control, calorie counting and at least 150 minutes of physical activity weekly. Avoid sweet beverages and drink more water. Eat at least 6 servings of fruit and vegetables daily   I discussed the assessment and treatment plan with the patient. The patient was provided an opportunity to ask questions and all were answered. The patient agreed with the plan and demonstrated an understanding of the instructions.  The patient was advised to call back or seek an in-person evaluation if the symptoms worsen or if the condition fails to improve as anticipated.  I provided 25 minutes of non-face-to-face time during this encounter.

## 2019-01-05 ENCOUNTER — Other Ambulatory Visit: Payer: Self-pay

## 2019-01-05 DIAGNOSIS — M549 Dorsalgia, unspecified: Secondary | ICD-10-CM | POA: Diagnosis not present

## 2019-01-05 DIAGNOSIS — M25512 Pain in left shoulder: Secondary | ICD-10-CM | POA: Insufficient documentation

## 2019-01-05 DIAGNOSIS — Z5321 Procedure and treatment not carried out due to patient leaving prior to being seen by health care provider: Secondary | ICD-10-CM | POA: Insufficient documentation

## 2019-01-05 DIAGNOSIS — M542 Cervicalgia: Secondary | ICD-10-CM | POA: Insufficient documentation
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
## 2019-01-06 ENCOUNTER — Ambulatory Visit (INDEPENDENT_AMBULATORY_CARE_PROVIDER_SITE_OTHER): Payer: Commercial Managed Care - PPO

## 2019-01-06 ENCOUNTER — Emergency Department
Admission: EM | Admit: 2019-01-06 | Discharge: 2019-01-06 | Discharge status: Against Medical Advice | Payer: Commercial Managed Care - PPO | Attending: Emergency Medicine | Admitting: Emergency Medicine

## 2019-01-06 ENCOUNTER — Other Ambulatory Visit: Payer: Self-pay

## 2019-01-06 ENCOUNTER — Encounter: Payer: Self-pay | Admitting: Emergency Medicine

## 2019-01-06 ENCOUNTER — Ambulatory Visit (INDEPENDENT_AMBULATORY_CARE_PROVIDER_SITE_OTHER)
Admission: EM | Admit: 2019-01-06 | Discharge: 2019-01-06 | Disposition: A | Discharge status: Home / Self Care | Payer: Commercial Managed Care - PPO | Source: Home / Self Care

## 2019-01-06 DIAGNOSIS — S161XXA Strain of muscle, fascia and tendon at neck level, initial encounter: Secondary | ICD-10-CM

## 2019-01-06 MED ORDER — IBUPROFEN 800 MG PO TABS: 800.0000 mg | ORAL_TABLET | Freq: Three times a day (TID) | ORAL | 0 refills | Status: AC

## 2019-01-06 MED ORDER — METHOCARBAMOL 500 MG PO TABS: 500.0000 mg | ORAL_TABLET | Freq: Two times a day (BID) | ORAL | 0 refills | Status: AC

## 2019-01-06 NOTE — Discharge Instructions (Signed)
It was very nice meeting you today in clinic. Thank you for entrusting me with your care.   As discussed, you films were NEGATIVE. You pain is secondary to muscle strain and spasm.  Please utilize the medications that we discussed. Your prescriptions have been called in to your pharmacy. Use moist heat to help with the pain as well.   Make arrangements to follow up with your regular doctor in 1 week for re-evaluation. If your symptoms/condition worsens, please seek follow up care either here or in the ER. Please remember, our [facility] providers are "right here with you" when you need [place].   Again, it was my pleasure to take care of you today. Thank you for choosing our clinic. I hope that you start to feel better quickly.   Quentin Mulling, MSN, APRN, FNP-C, CEN Advanced Practice Provider [facility]
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
## 2019-01-07 ENCOUNTER — Other Ambulatory Visit: Payer: Self-pay | Admitting: Family Medicine

## 2019-01-07 NOTE — Telephone Encounter (Signed)
Refill request for general medication. Estrace   Last office visit 12/19/2018   Following up with GYN on 02/26/2019

## 2019-02-13 ENCOUNTER — Telehealth: Payer: Self-pay | Admitting: Emergency Medicine

## 2019-02-26 ENCOUNTER — Ambulatory Visit: Payer: Commercial Managed Care - PPO | Admitting: Certified Nurse Midwife

## 2019-02-26 NOTE — Progress Notes (Deleted)
Gynecology Annual Exam  PCP: Steele Sizer, MD  Chief Complaint: No chief complaint on file.   History of Present Illness:Jill Sampson is a 58 year old nulliparous female who presents for her annual exam. She had a TAH/BSO in 2000 for endometriosis and cervical dysplasia and currently takes Estratest 1/2 tab daily. She started breaking her tablet in half after her sister was diagnosed with breast cancer at age 16. She tried to stop the HT but could not due to worsening vasomotor symptoms.  She has had no spotting.   The patient's past medical history is detailed in the past medical history section.  Since her last annual GYN exam 09/12/2014, she has had no significant changes in her health history. Her mother has died from dementia at age 50.  She is sexually active. She does not have vaginal dryness.   Her most recent pap smear was obtained 09/12/2014 and was NIL/neg.  Her most recent mammogram obtained on 10/09/2015 was normal. She also had a MRI of the breasts 12/07/2012 which was negative. There is a positive history of breast cancer in her sister. Genetic testing has been done. She tested negative for BRCA 1, negative for BRCA 2, and BART negative. Her lifetime risk of breast cancer is 23.2%. There is also a family history of ovarian cancer in her paternal aunt. The patient does do monthly self breast exams.  She had a colonoscopy in 2011 that was normal. Her next colonoscopy is due in 10 years.  She had a recent DEXA scan obtained in 2014 that was normal.  The patient does not smoke.  The patient does not drink alcohol.  The patient does not use illegal drugs.  The patient does exercise occasionally by walking on a treadmill.  The patient does get adequate calcium in her diet.  She had a recent cholesterol screen in 2015 that was normal and she will be having testing this year by her PCP DR Ancil Boozer.     Review of Systems: ROS  Past Medical History:  Past Medical History:   Diagnosis Date  . Allergy   . Anxiety   . Asthma   . Chronic insomnia   . Depression   . Dyslipidemia   . Esophagitis   . Gastritis   . GERD (gastroesophageal reflux disease)   . History of melanoma   . Low back pain   . Panic attack   . Snoring   . Vitamin B 12 deficiency     Past Surgical History:  Past Surgical History:  Procedure Laterality Date  . ABDOMINAL HYSTERECTOMY    . CHOLECYSTECTOMY    . FUNCTIONAL ENDOSCOPIC SINUS SURGERY Bilateral   . PLANTAR FASCIA RELEASE Left 06/23/2017   Dr. Milinda Pointer  . REFRACTIVE SURGERY  2007  . SHOULDER SURGERY Left     Family History:  Family History  Problem Relation Age of Onset  . Alzheimer's disease Mother   . Hypertension Mother   . Stroke Mother   . Migraines Mother   . Heart attack Father   . Heart disease Father   . Cancer Sister        Breast  . Stroke Maternal Aunt   . Diabetes Maternal Uncle   . Kidney cancer Neg Hx   . Prostate cancer Neg Hx     Social History:  Social History   Socioeconomic History  . Marital status: Single    Spouse name: Not on file  . Number of children: Not  on file  . Years of education: Not on file  . Highest education level: Not on file  Occupational History    Employer: [employer]    Comment: contracted since retirement   Social Needs  . Financial resource strain: Not on file  . Food insecurity    Worry: Not on file    Inability: Not on file  . Transportation needs    Medical: Not on file    Non-medical: Not on file  Tobacco Use  . Smoking status: Never Smoker  . Smokeless tobacco: Never Used  Substance and Sexual Activity  . Alcohol use: No    Alcohol/week: 0.0 standard drinks  . Drug use: No  . Sexual activity: Not Currently  Lifestyle  . Physical activity    Days per week: Not on file    Minutes per session: Not on file  . Stress: Not on file  Relationships  . Social Herbalist on phone: Not on file    Gets together: Not on file    Attends religious  service: Not on file    Active member of club or organization: Not on file    Attends meetings of clubs or organizations: Not on file    Relationship status: Not on file  . Intimate partner violence    Fear of current or ex partner: Not on file    Emotionally abused: Not on file    Physically abused: Not on file    Forced sexual activity: Not on file  Other Topics Concern  . 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 Amoxicillin Rash  . Clindamycin/Lincomycin Rash  . Doxycycline Rash  . Levofloxacin Rash    rash rash  . Penicillins Rash    Medications: Prior to Admission medications   Medication Sig Start Date End Date Taking? Authorizing Provider  albuterol (PROVENTIL HFA;VENTOLIN HFA) 108 (90 Base) MCG/ACT inhaler TAKE 2 PUFFS BY MOUTH EVERY 6 HOURS AS NEEDED FOR WHEEZE OR SHORTNESS OF BREATH 09/19/18   Ancil Boozer, Drue Stager, MD  albuterol (PROVENTIL) (2.5 MG/3ML) 0.083% nebulizer solution Take 3 mLs (2.5 mg total) by nebulization every 6 (six) hours as needed for wheezing or shortness of breath. Do not use with [facility] 12/19/18   Steele Sizer, MD  buPROPion (WELLBUTRIN XL) 150 MG 24 hr tablet Take 1 tablet (150 mg total) by mouth daily. 12/19/18   Steele Sizer, MD  escitalopram (LEXAPRO) 10 MG tablet Take 1 tablet (10 mg total) by mouth daily. 12/19/18   Steele Sizer, MD  esomeprazole (NEXIUM) 40 MG capsule Take 1 capsule (40 mg total) by mouth every morning. 12/19/18   Steele Sizer, MD  estradiol (ESTRACE) 1 MG tablet Take 1 tablet (1 mg total) by mouth daily. 10/14/18   Steele Sizer, MD  fluticasone (FLONASE) 50 MCG/ACT nasal spray Place 2 sprays into both nostrils as needed. 09/19/18   Sowles, Drue Stager, MD  fluticasone furoate-vilanterol (BREO ELLIPTA) 100-25 MCG/INH AEPB Inhale 1 puff into the lungs daily. 12/19/18   Steele Sizer, MD  ibuprofen (ADVIL) 800  MG tablet Take 1 tablet (800 mg total) by mouth 3 (three) times daily. 01/06/19   Karen Kitchens, NP  levocetirizine (XYZAL) 5 MG tablet Take 1 tablet (5 mg total) by mouth every evening. 12/19/18   Steele Sizer, MD  meloxicam (MOBIC) 7.5 MG tablet Take  1 tablet (7.5 mg total) by mouth 2 (two) times daily as needed for pain. 12/19/18   Steele Sizer, MD  methocarbamol (ROBAXIN) 500 MG tablet Take 1 tablet (500 mg total) by mouth 2 (two) times daily. 01/06/19   Karen Kitchens, NP  Olopatadine HCl (PAZEO) 0.7 % SOLN  07/29/15   [provider]  Respiratory Therapy Supplies (NEBULIZER COMPRESSOR) KIT 1 each by Does not apply route daily. 11/01/17   Steele Sizer, MD  Respiratory Therapy Supplies (NEBULIZER/ADULT MASK) KIT 1 each by Does not apply route daily. 11/01/17   Steele Sizer, MD    Physical Exam Vitals: There were no vitals taken for this visit.  General: NAD HEENT: normocephalic, anicteric Neck: no thyroid enlargement, no palpable nodules, no cervical lymphadenopathy  Pulmonary: No increased work of breathing, CTAB Cardiovascular: RRR, {Blank single:19197::"with murmur","without murmur"}  Breast: Breast symmetrical, no tenderness, no palpable nodules or masses, no skin or nipple retraction present, no nipple discharge.  No axillary, infraclavicular or supraclavicular lymphadenopathy. Abdomen: Soft, non-tender, non-distended.  Umbilicus without lesions.  No hepatomegaly or masses palpable. No evidence of hernia. Genitourinary:  External: Normal external female genitalia.  Normal urethral meatus, normal  Bartholin's and Skene's glands.    Vagina: Normal vaginal mucosa, no evidence of prolapse.    Cervix: Grossly normal in appearance, no bleeding, non-tender  Uterus: Anteverted, normal size, shape, and consistency, mobile, and non-tender  Adnexa: No adnexal masses, non-tender  Rectal: deferred  Lymphatic: no evidence of inguinal lymphadenopathy Extremities: no edema,  erythema, or tenderness Neurologic: Grossly intact Psychiatric: mood appropriate, affect full     Assessment: 58 y.o. No obstetric history on file. No problem-specific Assessment & Plan notes found for this encounter.   Plan:  ***  1) Breast cancer screening - recommend monthly self breast exam. {Blank single:19197::" ","Mammogram is up to date.","Mammogram was ordered today."}  2) STI screening was offered and {Blank single:19197::"accepted","declined"}.  3) Cervical cancer screening - {Blank single:19197::"Pap smear due in *** years","Pap not indicated","Pap was done"}. ASCCP guidelines and rational discussed.  Patient opts for {Blank single:19197::"every 5 years","every 3 years","yearly"} screening interval  4) Contraception - Education given regarding options for contraception  5) Routine healthcare maintenance including cholesterol and diabetes screening {Blank single:19197::"declined","managed by PCP","ordered today"}

## 2019-04-11 ENCOUNTER — Other Ambulatory Visit: Payer: Self-pay | Admitting: Family Medicine

## 2019-04-11 DIAGNOSIS — K219 Gastro-esophageal reflux disease without esophagitis: Secondary | ICD-10-CM

## 2019-04-16 ENCOUNTER — Other Ambulatory Visit: Payer: Self-pay | Admitting: Family Medicine

## 2019-04-16 DIAGNOSIS — F325 Major depressive disorder, single episode, in full remission: Secondary | ICD-10-CM

## 2019-04-16 DIAGNOSIS — M2550 Pain in unspecified joint: Secondary | ICD-10-CM

## 2019-04-16 DIAGNOSIS — J4521 Mild intermittent asthma with (acute) exacerbation: Secondary | ICD-10-CM

## 2019-04-16 NOTE — Telephone Encounter (Signed)
albuterol (PROVENTIL) (2.5 MG/3ML) 0.083% nebulizer solution  estradiol (ESTRACE) 1 MG tablet  meloxicam (MOBIC) 7.5 MG tablet  buPROPion (WELLBUTRIN XL) 150 MG 24 hr tablet   Send to [facility] phone # [phone]

## 2019-04-17 NOTE — Telephone Encounter (Signed)
LVM TO SCHEDULE APPT FOR FOLLOWUP / MED REFILL

## 2019-04-21 IMAGING — MR MR HEEL *L* W/O CM
5 series · 40 of 40 positions shown · non-contrast
Comparison: None.

CLINICAL DATA: Left heel pain since the patient stepped on a
seashell in December 2016. History of prior surgery for plantar
fasciitis 06/23/2017.

EXAM:
MR OF THE LEFT HEEL WITHOUT CONTRAST
TECHNIQUE: Multiplanar, multisequence MR imaging of the ankle was performed. No
intravenous contrast was administered.

[Series 3: PD fat-sat · axial · 3.0mm · 0.62mm/px · z∈[-101,+27]mm · 9 of 40 slices shown]
[im 1/40]
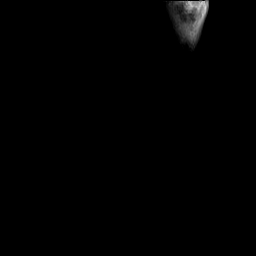
[im 5/40]
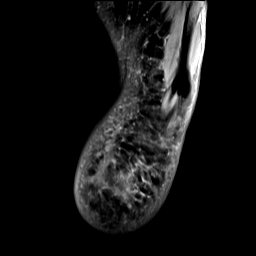
[im 10/40]
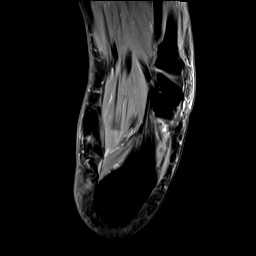
[im 15/40]
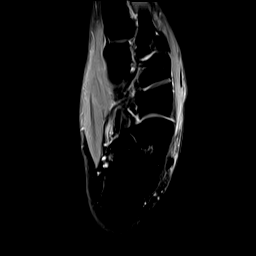
[im 20/40]
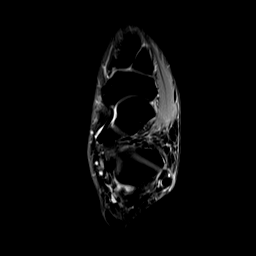
[im 25/40]
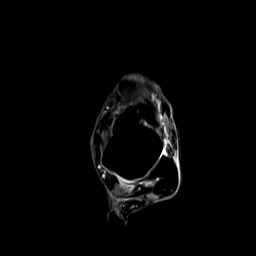
[im 30/40]
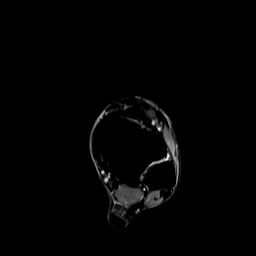
[im 35/40]
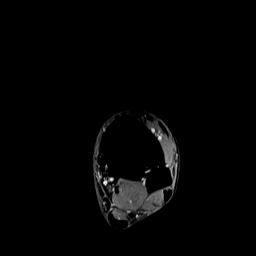
[im 40/40]
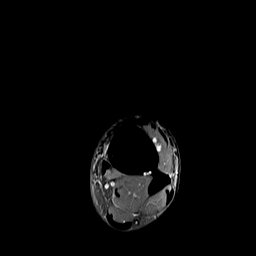

[Series 4: T2 fat-sat · axial · 3.0mm · 0.62mm/px · z∈[-101,+27]mm · 9 of 40 slices shown (1 of 3)]
[im 1/40]
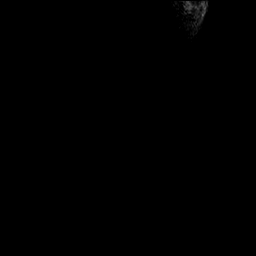
[im 5/40]
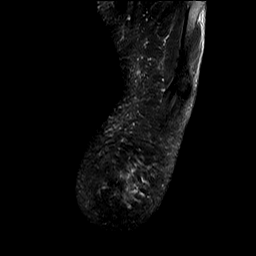
[im 10/40]
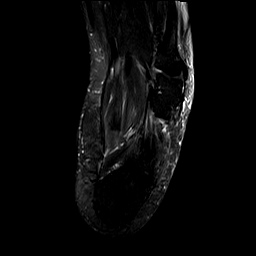
[im 15/40]
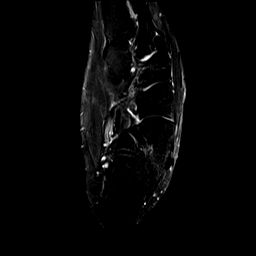
[im 20/40]
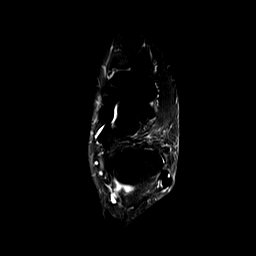
[im 25/40]
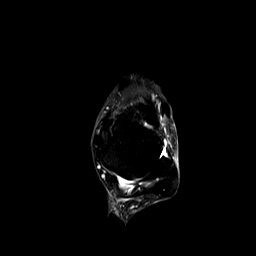
[im 30/40]
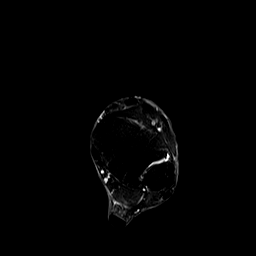
[im 35/40]
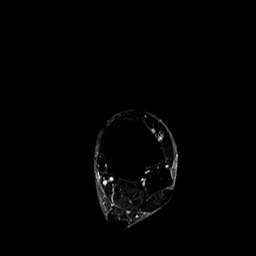
[im 40/40]
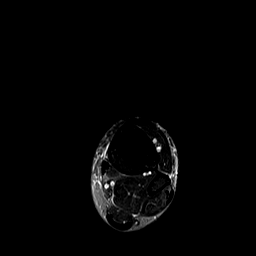

[Series 5: T2 fat-sat · coronal · 3.0mm · 0.70mm/px · 10 of 45 slices shown (2 of 3)]
[im 1/45]
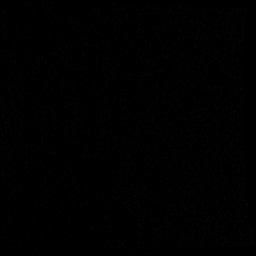
[im 5/45]
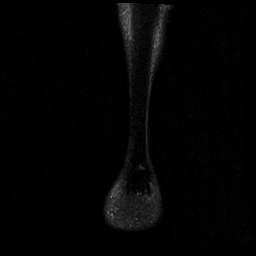
[im 10/45]
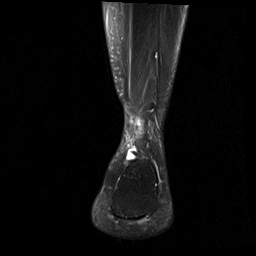
[im 15/45]
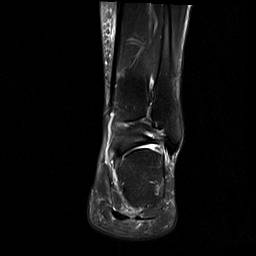
[im 20/45]
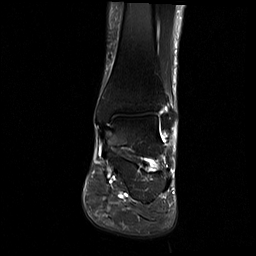
[im 25/45]
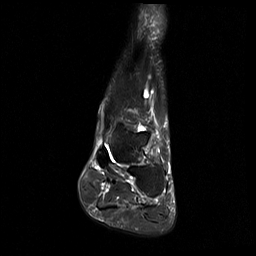
[im 30/45]
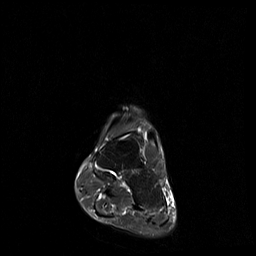
[im 35/45]
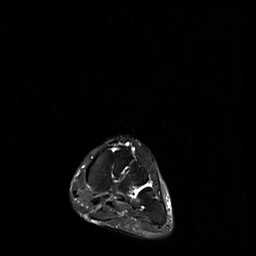
[im 40/45]
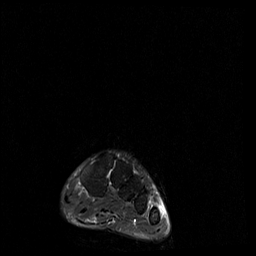
[im 45/45]
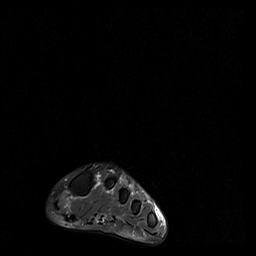

[Series 6: T1 · sagittal · 3.0mm · 0.56mm/px · 6 of 25 slices shown]
[im 1/25]
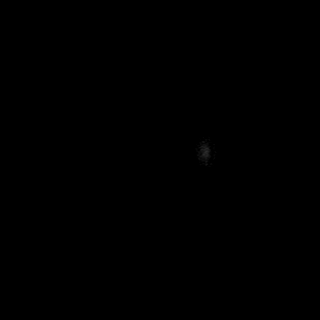
[im 5/25]
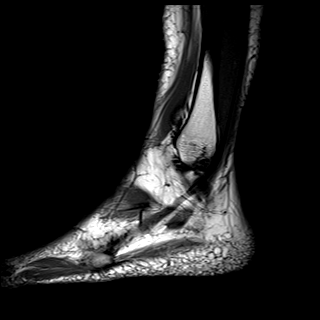
[im 10/25]
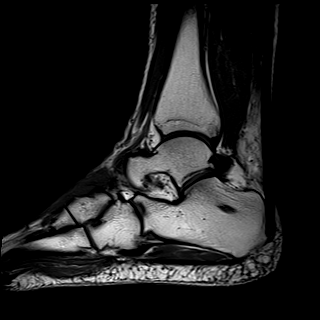
[im 15/25]
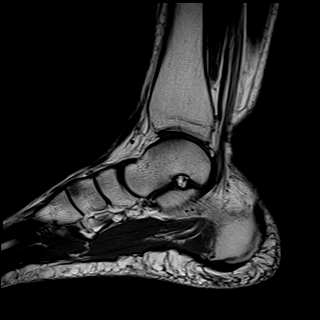
[im 20/25]
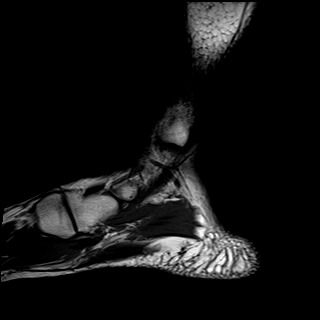
[im 25/25]
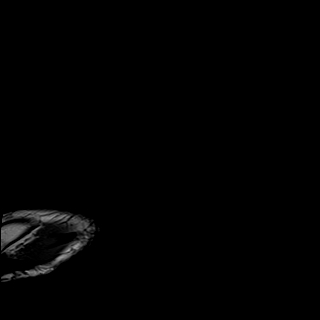

[Series 7: T2 fat-sat · sagittal · 3.0mm · 0.70mm/px · 6 of 25 slices shown (3 of 3)]
[im 1/25]
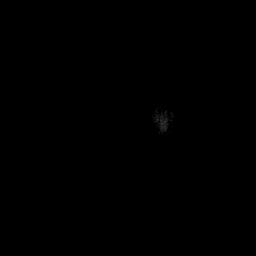
[im 5/25]
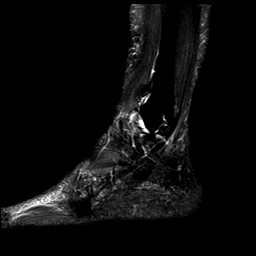
[im 10/25]
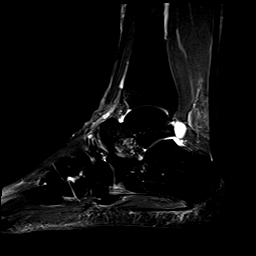
[im 15/25]
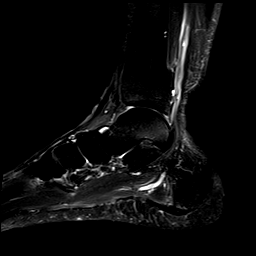
[im 20/25]
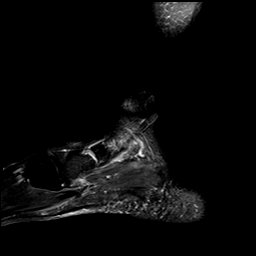
[im 25/25]
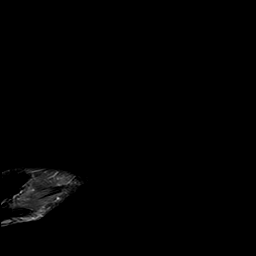

[40 of 40 positions shown; findings below may reference images not displayed]

FINDINGS: TENDONS

Peroneal: Intact.

Posteromedial: Intact.

Anterior: Intact.

Achilles: Intact.

Plantar Fascia: Defect in the medial cord 2.5 cm distal to its
attachment to the calcaneus is consistent with plantar fascia
release. A small volume of fluid is seen about the proximal medial
cord and there is minimal edema within the proximal medial cord.
Lateral cord appears normal.

LIGAMENTS

Lateral: Intact.

Medial: Intact.

CARTILAGE

Ankle Joint: Negative.

Subtalar Joints/Sinus Tarsi: Normal.

Bones: Normal marrow signal throughout.

Other: None.
IMPRESSION: Status post release of the medial cord of the plantar fascia without
evidence of complication. A minimal amount of fluid is seen about
the proximal aspect of the medial cord and there is mild edema
present suggestive of mild residual inflammatory change. The exam is
otherwise negative.

## 2019-04-26 ENCOUNTER — Other Ambulatory Visit: Payer: Self-pay | Admitting: Family Medicine

## 2019-04-26 DIAGNOSIS — F325 Major depressive disorder, single episode, in full remission: Secondary | ICD-10-CM

## 2019-04-26 NOTE — Telephone Encounter (Signed)
Requested medication (s) are due for refill today: yes  Requested medication (s) are on the active medication list: yes  Last refill:  01/30/2019  Future visit scheduled: no  Notes to clinic: review for refill   Requested Prescriptions  Pending Prescriptions Disp Refills   buPROPion (WELLBUTRIN XL) 150 MG 24 hr tablet [Pharmacy Med Name: BUPROPION HCL XL 150 MG TABLET] 90 tablet 0    Sig: TAKE 1 [place]     Psychiatry: Antidepressants - bupropion Failed - 04/26/2019 12:54 AM      Failed - Last BP in normal range    BP Readings from Last 1 Encounters:  01/06/19 140/81         Failed - Valid encounter within last 6 months    Recent Outpatient Visits          4 months ago Major depression in remission [facility])   [facility] Steele Sizer, MD   7 months ago Major depression in remission [facility])   [facility] [place], Drue Stager, MD   11 months ago Allergic asthma, mild intermittent, with acute exacerbation   [place], FNP   1 year ago Asthma, moderate persistent, poorly-controlled   [facility] Steele Sizer, MD   1 year ago Major depression in remission [facility])   [facility] [place], Drue Stager, MD             Failed - Completed PHQ-2 or PHQ-9 in the last 360 days.

## 2019-04-29 NOTE — Telephone Encounter (Signed)
lvm for informing pt that prescription has been sent to pharmacy and for her to schedule appt within the next 30 days

## 2019-08-21 ENCOUNTER — Telehealth: Payer: Self-pay | Admitting: Family Medicine

## 2019-08-21 DIAGNOSIS — F325 Major depressive disorder, single episode, in full remission: Secondary | ICD-10-CM

## 2019-08-21 NOTE — Telephone Encounter (Signed)
Requested medication (s) are due for refill today -yes  Requested medication (s) are on the active medication list -yes  Future visit scheduled -no  Last refill: 05/11/19  Notes to clinic: Attempted to call patient to schedule follow up appointment- left message to call office to schedule. Refill request sent to office for review of request.  Requested Prescriptions  Pending Prescriptions Disp Refills   buPROPion (WELLBUTRIN XL) 150 MG 24 hr tablet [Pharmacy Med Name: BUPROPION HCL XL 150 MG TABLET] 90 tablet 0    Sig: TAKE 1 TABLET BY MOUTH EVERY DAY      Psychiatry: Antidepressants - bupropion Failed - 08/21/2019 11:07 AM      Failed - Last BP in normal range    BP Readings from Last 1 Encounters:  01/06/19 140/81          Failed - Valid encounter within last 6 months    Recent Outpatient Visits           8 months ago Major depression in remission [facility])   [facility] Alba Cory, MD   11 months ago Major depression in remission [facility])   [facility] Alba Cory, MD   1 year ago Allergic asthma, mild intermittent, with acute exacerbation   [facility] Doren Custard, FNP   1 year ago Asthma, moderate persistent, poorly-controlled   [facility] [place], Danna Hefty, MD   2 years ago Major depression in remission [facility])   [facility] Alba Cory, MD                  Requested Prescriptions  Pending Prescriptions Disp Refills   buPROPion (WELLBUTRIN XL) 150 MG 24 hr tablet [Pharmacy Med Name: BUPROPION HCL XL 150 MG TABLET] 90 tablet 0    Sig: TAKE 1 TABLET BY MOUTH EVERY DAY      Psychiatry: Antidepressants - bupropion Failed - 08/21/2019 11:07 AM      Failed - Last BP in normal range    BP Readings from Last 1 Encounters:  01/06/19 140/81          Failed - Valid encounter within last 6 months    Recent Outpatient Visits           8 months ago Major  depression in remission [facility])   [facility] Alba Cory, MD   11 months ago Major depression in remission [facility])   [facility] Alba Cory, MD   1 year ago Allergic asthma, mild intermittent, with acute exacerbation   [facility] Doren Custard, FNP   1 year ago Asthma, moderate persistent, poorly-controlled   [facility] [place], Danna Hefty, MD   2 years ago Major depression in remission [facility])   [facility] Alba Cory, MD

## 2019-08-22 NOTE — Telephone Encounter (Signed)
Pt said that she moved in July to [facility] and has a provider down there.

## 2019-10-06 ENCOUNTER — Other Ambulatory Visit: Payer: Self-pay | Admitting: Family Medicine

## 2019-10-06 DIAGNOSIS — J302 Other seasonal allergic rhinitis: Secondary | ICD-10-CM

## 2019-10-06 DIAGNOSIS — J3089 Other allergic rhinitis: Secondary | ICD-10-CM

## 2019-10-06 DIAGNOSIS — J4530 Mild persistent asthma, uncomplicated: Secondary | ICD-10-CM

## 2019-10-06 NOTE — Telephone Encounter (Signed)
Requested medication (s) are due for refill today: yes  Requested medication (s) are on the active medication list: yes  Last refill:  07/15/2019  Future visit scheduled: no  Notes to clinic:  no valid encounter within last 12 months   Requested Prescriptions  Pending Prescriptions Disp Refills   levocetirizine (XYZAL) 5 MG tablet [Pharmacy Med Name: LEVOCETIRIZINE 5 MG TABLET] 90 tablet 0    Sig: TAKE 1 TABLET BY MOUTH EVERY DAY IN THE EVENING      Ear, Nose, and Throat:  Antihistamines Failed - 10/06/2019  1:03 PM      Failed - Valid encounter within last 12 months    Recent Outpatient Visits           9 months ago Major depression in remission [facility])   [facility] Alba Cory, MD   1 year ago Major depression in remission [facility])   [facility] Alba Cory, MD   1 year ago Allergic asthma, mild intermittent, with acute exacerbation   [facility] Doren Custard, FNP   1 year ago Asthma, moderate persistent, poorly-controlled   [facility] [place], Danna Hefty, MD   2 years ago Major depression in remission [facility])   [facility] Alba Cory, MD

## 2019-10-08 NOTE — Telephone Encounter (Signed)
Pt is no longer a pt here. She has moved

## 2020-02-27 ENCOUNTER — Other Ambulatory Visit: Payer: Self-pay | Admitting: Family Medicine

## 2020-02-27 DIAGNOSIS — J4530 Mild persistent asthma, uncomplicated: Secondary | ICD-10-CM

## 2020-02-27 DIAGNOSIS — J302 Other seasonal allergic rhinitis: Secondary | ICD-10-CM

## 2020-02-27 NOTE — Telephone Encounter (Signed)
No longer patient at practice- per last note

## 2020-03-03 ENCOUNTER — Other Ambulatory Visit: Payer: Self-pay | Admitting: Family Medicine

## 2020-03-03 DIAGNOSIS — J3089 Other allergic rhinitis: Secondary | ICD-10-CM

## 2020-03-03 DIAGNOSIS — J4530 Mild persistent asthma, uncomplicated: Secondary | ICD-10-CM
